# Patient Record
Sex: Female | Born: 1955
Health system: Southern US, Community
[De-identification: ages and names within clinical notes are randomized; demographics above are authoritative.]

## PROBLEM LIST (undated history)

## (undated) DIAGNOSIS — J45909 Unspecified asthma, uncomplicated: Secondary | ICD-10-CM

## (undated) DIAGNOSIS — J449 Chronic obstructive pulmonary disease, unspecified: Secondary | ICD-10-CM

## (undated) DIAGNOSIS — M1990 Unspecified osteoarthritis, unspecified site: Secondary | ICD-10-CM

## (undated) DIAGNOSIS — F419 Anxiety disorder, unspecified: Secondary | ICD-10-CM

## (undated) DIAGNOSIS — G709 Myoneural disorder, unspecified: Secondary | ICD-10-CM

## (undated) DIAGNOSIS — T7840XA Allergy, unspecified, initial encounter: Secondary | ICD-10-CM

## (undated) DIAGNOSIS — I10 Essential (primary) hypertension: Secondary | ICD-10-CM

## (undated) DIAGNOSIS — E785 Hyperlipidemia, unspecified: Secondary | ICD-10-CM

## (undated) DIAGNOSIS — M719 Bursopathy, unspecified: Secondary | ICD-10-CM

## (undated) DIAGNOSIS — D649 Anemia, unspecified: Secondary | ICD-10-CM

## (undated) DIAGNOSIS — M810 Age-related osteoporosis without current pathological fracture: Secondary | ICD-10-CM

## (undated) HISTORY — DX: Anxiety disorder, unspecified: F41.9

## (undated) HISTORY — DX: Unspecified osteoarthritis, unspecified site: M19.90

## (undated) HISTORY — DX: Myoneural disorder, unspecified: G70.9

## (undated) HISTORY — PX: POLYPECTOMY: SHX149

## (undated) HISTORY — DX: Hyperlipidemia, unspecified: E78.5

## (undated) HISTORY — PX: BREAST BIOPSY: SHX20

## (undated) HISTORY — PX: COLONOSCOPY: SHX174

## (undated) HISTORY — DX: Allergy, unspecified, initial encounter: T78.40XA

## (undated) HISTORY — DX: Anemia, unspecified: D64.9

---

## 1997-08-31 ENCOUNTER — Inpatient Hospital Stay (HOSPITAL_COMMUNITY): Admission: AD | Admit: 1997-08-31 | Discharge: 1997-09-04 | Payer: Self-pay | Admitting: Psychiatry

## 1998-04-17 ENCOUNTER — Emergency Department (HOSPITAL_COMMUNITY): Admission: EM | Admit: 1998-04-17 | Discharge: 1998-04-17 | Payer: Self-pay | Admitting: Emergency Medicine

## 1998-11-14 ENCOUNTER — Emergency Department (HOSPITAL_COMMUNITY): Admission: EM | Admit: 1998-11-14 | Discharge: 1998-11-14 | Payer: Self-pay | Admitting: Emergency Medicine

## 1998-12-06 ENCOUNTER — Emergency Department (HOSPITAL_COMMUNITY): Admission: EM | Admit: 1998-12-06 | Discharge: 1998-12-06 | Payer: Self-pay | Admitting: Emergency Medicine

## 1998-12-06 ENCOUNTER — Encounter: Payer: Self-pay | Admitting: Emergency Medicine

## 1999-01-09 ENCOUNTER — Emergency Department (HOSPITAL_COMMUNITY): Admission: EM | Admit: 1999-01-09 | Discharge: 1999-01-09 | Payer: Self-pay | Admitting: Emergency Medicine

## 1999-01-23 ENCOUNTER — Emergency Department (HOSPITAL_COMMUNITY): Admission: EM | Admit: 1999-01-23 | Discharge: 1999-01-23 | Payer: Self-pay | Admitting: Emergency Medicine

## 1999-02-05 ENCOUNTER — Encounter: Admission: RE | Admit: 1999-02-05 | Discharge: 1999-02-05 | Payer: Self-pay | Admitting: Orthopedic Surgery

## 1999-02-05 ENCOUNTER — Encounter: Payer: Self-pay | Admitting: Orthopedic Surgery

## 1999-02-22 ENCOUNTER — Emergency Department (HOSPITAL_COMMUNITY): Admission: EM | Admit: 1999-02-22 | Discharge: 1999-02-22 | Payer: Self-pay | Admitting: Emergency Medicine

## 1999-02-24 ENCOUNTER — Emergency Department (HOSPITAL_COMMUNITY): Admission: EM | Admit: 1999-02-24 | Discharge: 1999-02-24 | Payer: Self-pay | Admitting: Emergency Medicine

## 1999-08-27 ENCOUNTER — Emergency Department (HOSPITAL_COMMUNITY): Admission: EM | Admit: 1999-08-27 | Discharge: 1999-08-27 | Payer: Self-pay | Admitting: Emergency Medicine

## 1999-10-03 ENCOUNTER — Encounter: Payer: Self-pay | Admitting: Emergency Medicine

## 1999-10-03 ENCOUNTER — Emergency Department (HOSPITAL_COMMUNITY): Admission: EM | Admit: 1999-10-03 | Discharge: 1999-10-03 | Payer: Self-pay | Admitting: Emergency Medicine

## 2000-01-12 ENCOUNTER — Emergency Department (HOSPITAL_COMMUNITY): Admission: EM | Admit: 2000-01-12 | Discharge: 2000-01-12 | Payer: Self-pay | Admitting: Emergency Medicine

## 2000-03-01 ENCOUNTER — Emergency Department (HOSPITAL_COMMUNITY): Admission: EM | Admit: 2000-03-01 | Discharge: 2000-03-01 | Payer: Self-pay | Admitting: Emergency Medicine

## 2000-03-13 ENCOUNTER — Emergency Department (HOSPITAL_COMMUNITY): Admission: EM | Admit: 2000-03-13 | Discharge: 2000-03-13 | Payer: Self-pay | Admitting: Emergency Medicine

## 2000-03-19 ENCOUNTER — Emergency Department (HOSPITAL_COMMUNITY): Admission: EM | Admit: 2000-03-19 | Discharge: 2000-03-19 | Payer: Self-pay | Admitting: *Deleted

## 2002-01-06 HISTORY — PX: BACK SURGERY: SHX140

## 2002-03-30 ENCOUNTER — Ambulatory Visit (HOSPITAL_COMMUNITY): Admission: RE | Admit: 2002-03-30 | Discharge: 2002-03-30 | Payer: Self-pay | Admitting: Pulmonary Disease

## 2002-03-30 ENCOUNTER — Encounter: Payer: Self-pay | Admitting: Pulmonary Disease

## 2002-04-29 ENCOUNTER — Encounter: Payer: Self-pay | Admitting: Pulmonary Disease

## 2002-04-29 ENCOUNTER — Ambulatory Visit (HOSPITAL_COMMUNITY): Admission: RE | Admit: 2002-04-29 | Discharge: 2002-04-29 | Payer: Self-pay | Admitting: Pulmonary Disease

## 2002-05-31 ENCOUNTER — Encounter: Payer: Self-pay | Admitting: Gastroenterology

## 2002-05-31 ENCOUNTER — Encounter: Admission: RE | Admit: 2002-05-31 | Discharge: 2002-05-31 | Payer: Self-pay | Admitting: Gastroenterology

## 2002-10-24 ENCOUNTER — Encounter: Payer: Self-pay | Admitting: Radiology

## 2002-10-24 ENCOUNTER — Encounter: Payer: Self-pay | Admitting: Family Medicine

## 2002-10-24 ENCOUNTER — Encounter: Admission: RE | Admit: 2002-10-24 | Discharge: 2002-10-24 | Payer: Self-pay | Admitting: Family Medicine

## 2002-11-14 ENCOUNTER — Encounter: Admission: RE | Admit: 2002-11-14 | Discharge: 2002-11-14 | Payer: Self-pay | Admitting: Family Medicine

## 2002-11-29 ENCOUNTER — Encounter: Admission: RE | Admit: 2002-11-29 | Discharge: 2002-11-29 | Payer: Self-pay | Admitting: Family Medicine

## 2002-12-21 ENCOUNTER — Ambulatory Visit (HOSPITAL_COMMUNITY): Admission: RE | Admit: 2002-12-21 | Discharge: 2002-12-22 | Payer: Self-pay | Admitting: Neurological Surgery

## 2003-12-21 ENCOUNTER — Other Ambulatory Visit: Admission: RE | Admit: 2003-12-21 | Discharge: 2003-12-21 | Payer: Self-pay | Admitting: Obstetrics and Gynecology

## 2003-12-28 ENCOUNTER — Encounter: Admission: RE | Admit: 2003-12-28 | Discharge: 2003-12-28 | Payer: Self-pay | Admitting: Internal Medicine

## 2004-01-05 ENCOUNTER — Inpatient Hospital Stay (HOSPITAL_COMMUNITY): Admission: RE | Admit: 2004-01-05 | Discharge: 2004-01-07 | Payer: Self-pay | Admitting: Obstetrics and Gynecology

## 2004-01-07 HISTORY — PX: KNEE ARTHROPLASTY: SHX992

## 2005-02-28 ENCOUNTER — Encounter: Admission: RE | Admit: 2005-02-28 | Discharge: 2005-02-28 | Payer: Self-pay | Admitting: Internal Medicine

## 2006-01-11 ENCOUNTER — Emergency Department (HOSPITAL_COMMUNITY): Admission: EM | Admit: 2006-01-11 | Discharge: 2006-01-11 | Payer: Self-pay | Admitting: Family Medicine

## 2007-01-26 ENCOUNTER — Emergency Department (HOSPITAL_COMMUNITY): Admission: EM | Admit: 2007-01-26 | Discharge: 2007-01-26 | Payer: Self-pay | Admitting: Emergency Medicine

## 2007-03-28 ENCOUNTER — Emergency Department (HOSPITAL_COMMUNITY): Admission: EM | Admit: 2007-03-28 | Discharge: 2007-03-28 | Payer: Self-pay | Admitting: Family Medicine

## 2007-04-11 ENCOUNTER — Emergency Department (HOSPITAL_COMMUNITY): Admission: EM | Admit: 2007-04-11 | Discharge: 2007-04-11 | Payer: Self-pay | Admitting: Family Medicine

## 2008-11-22 ENCOUNTER — Emergency Department (HOSPITAL_COMMUNITY): Admission: EM | Admit: 2008-11-22 | Discharge: 2008-11-22 | Payer: Self-pay | Admitting: Family Medicine

## 2009-04-29 ENCOUNTER — Emergency Department (HOSPITAL_COMMUNITY): Admission: EM | Admit: 2009-04-29 | Discharge: 2009-04-29 | Payer: Self-pay | Admitting: Emergency Medicine

## 2010-01-16 ENCOUNTER — Encounter
Admission: RE | Admit: 2010-01-16 | Discharge: 2010-01-16 | Payer: Self-pay | Source: Home / Self Care | Attending: Internal Medicine | Admitting: Internal Medicine

## 2010-01-18 ENCOUNTER — Encounter
Admission: RE | Admit: 2010-01-18 | Discharge: 2010-01-18 | Payer: Self-pay | Source: Home / Self Care | Attending: Internal Medicine | Admitting: Internal Medicine

## 2010-02-04 ENCOUNTER — Other Ambulatory Visit: Payer: Self-pay | Admitting: General Surgery

## 2010-02-04 DIAGNOSIS — N6310 Unspecified lump in the right breast, unspecified quadrant: Secondary | ICD-10-CM

## 2010-02-07 ENCOUNTER — Other Ambulatory Visit: Payer: Self-pay | Admitting: General Surgery

## 2010-02-07 DIAGNOSIS — N6310 Unspecified lump in the right breast, unspecified quadrant: Secondary | ICD-10-CM

## 2010-02-12 ENCOUNTER — Ambulatory Visit
Admission: RE | Admit: 2010-02-12 | Discharge: 2010-02-12 | Disposition: A | Discharge status: Home / Self Care | Payer: 59 | Source: Ambulatory Visit | Attending: General Surgery | Admitting: General Surgery

## 2010-02-12 ENCOUNTER — Encounter (HOSPITAL_BASED_OUTPATIENT_CLINIC_OR_DEPARTMENT_OTHER)
Admission: RE | Admit: 2010-02-12 | Discharge: 2010-02-12 | Disposition: A | Discharge status: Home / Self Care | Payer: 59 | Source: Ambulatory Visit | Attending: General Surgery | Admitting: General Surgery

## 2010-02-12 ENCOUNTER — Other Ambulatory Visit: Payer: Self-pay | Admitting: General Surgery

## 2010-02-12 DIAGNOSIS — Z01812 Encounter for preprocedural laboratory examination: Secondary | ICD-10-CM | POA: Insufficient documentation

## 2010-02-12 DIAGNOSIS — Z01811 Encounter for preprocedural respiratory examination: Secondary | ICD-10-CM

## 2010-02-12 DIAGNOSIS — Z01810 Encounter for preprocedural cardiovascular examination: Secondary | ICD-10-CM | POA: Insufficient documentation

## 2010-02-12 LAB — COMPREHENSIVE METABOLIC PANEL
ALT: 31 U/L (ref 0–35)
AST: 18 U/L (ref 0–37)
Albumin: 3.6 g/dL (ref 3.5–5.2)
Alkaline Phosphatase: 80 U/L (ref 39–117)
BUN: 5 mg/dL — ABNORMAL LOW (ref 6–23)
CO2: 26 mEq/L (ref 19–32)
Calcium: 9.1 mg/dL (ref 8.4–10.5)
Chloride: 105 mEq/L (ref 96–112)
Creatinine, Ser: 0.75 mg/dL (ref 0.4–1.2)
GFR calc Af Amer: >60 mL/min (ref >60)
GFR calc non Af Amer: >60 mL/min (ref >60)
Glucose, Bld: 76 mg/dL (ref 70–99)
Potassium: 4 mEq/L (ref 3.5–5.1)
Sodium: 139 mEq/L (ref 135–145)
Total Bilirubin: 0.2 mg/dL — ABNORMAL LOW (ref 0.3–1.2)
Total Protein: 7.1 g/dL (ref 6.0–8.3)

## 2010-02-12 LAB — CBC
HCT: 32.9 % — ABNORMAL LOW (ref 36.0–46.0)
Hemoglobin: 10.7 g/dL — ABNORMAL LOW (ref 12.0–15.0)
MCH: 25.7 pg — ABNORMAL LOW (ref 26.0–34.0)
MCHC: 32.5 g/dL (ref 30.0–36.0)
MCV: 78.9 fL (ref 78.0–100.0)
Platelets: 505 10*3/uL — ABNORMAL HIGH (ref 150–400)
RBC: 4.17 MIL/uL (ref 3.87–5.11)
RDW: 14.2 % (ref 11.5–15.5)
WBC: 11.5 10*3/uL — ABNORMAL HIGH (ref 4.0–10.5)

## 2010-02-12 LAB — URINALYSIS, ROUTINE W REFLEX MICROSCOPIC
Bilirubin Urine: NEGATIVE
Hgb urine dipstick: NEGATIVE
Ketones, ur: NEGATIVE mg/dL
Nitrite: NEGATIVE
Protein, ur: NEGATIVE mg/dL
Specific Gravity, Urine: 1.014 (ref 1.005–1.030)
Urine Glucose, Fasting: NEGATIVE mg/dL
Urobilinogen, UA: 1 mg/dL (ref 0.0–1.0)
pH: 6 (ref 5.0–8.0)

## 2010-02-12 LAB — DIFFERENTIAL
Basophils Absolute: 0 10*3/uL (ref 0.0–0.1)
Basophils Relative: 0 % (ref 0–1)
Eosinophils Absolute: 0.1 10*3/uL (ref 0.0–0.7)
Eosinophils Relative: 1 % (ref 0–5)
Lymphocytes Relative: 39 % (ref 12–46)
Lymphs Abs: 4.5 10*3/uL — ABNORMAL HIGH (ref 0.7–4.0)
Monocytes Absolute: 1 10*3/uL (ref 0.1–1.0)
Monocytes Relative: 8 % (ref 3–12)
Neutro Abs: 5.9 10*3/uL (ref 1.7–7.7)
Neutrophils Relative %: 51 % (ref 43–77)

## 2010-02-15 ENCOUNTER — Ambulatory Visit
Admission: RE | Admit: 2010-02-15 | Discharge: 2010-02-15 | Disposition: A | Discharge status: Home / Self Care | Payer: 59 | Source: Ambulatory Visit | Attending: General Surgery | Admitting: General Surgery

## 2010-02-15 ENCOUNTER — Other Ambulatory Visit: Payer: Self-pay | Admitting: General Surgery

## 2010-02-15 ENCOUNTER — Ambulatory Visit (HOSPITAL_BASED_OUTPATIENT_CLINIC_OR_DEPARTMENT_OTHER)
Admission: RE | Admit: 2010-02-15 | Discharge: 2010-02-15 | Disposition: A | Discharge status: Home / Self Care | Payer: 59 | Source: Ambulatory Visit | Attending: General Surgery | Admitting: General Surgery

## 2010-02-15 DIAGNOSIS — D249 Benign neoplasm of unspecified breast: Secondary | ICD-10-CM | POA: Insufficient documentation

## 2010-02-15 DIAGNOSIS — N6310 Unspecified lump in the right breast, unspecified quadrant: Secondary | ICD-10-CM

## 2010-02-17 NOTE — Op Note (Signed)
NAMEMARIJAYNE, Julia Howard             ACCOUNT NO.:  [id number]  MEDICAL RECORD NO.:  [id number]            PATIENT TYPE:  LOCATION:                                 FACILITY:  PHYSICIAN:  Angelia Mould. Derrell Lolling, M.D.     DATE OF BIRTH:  DATE OF PROCEDURE:  02/15/2010 DATE OF DISCHARGE:                              OPERATIVE REPORT   PREOPERATIVE DIAGNOSIS:  Intraductal papilloma, right breast.  POSTOPERATIVE DIAGNOSIS:  Intraductal papilloma, right breast.  OPERATION PERFORMED:  Right partial mastectomy with needle localization.  SURGEON:  Angelia Mould. Derrell Lolling, MD  OPERATIVE INDICATIONS:  This is a 55 year old African American female who had a mammogram 4 years ago, which was reportedly normal, and then had recent mammograms for screening.  The radiologist said he could palpate a tiny mass at the 11 o'clock position of the right breast. Ultrasound showed a solid nodule about 10 mm in diameter in this location.  No other abnormalities were noted.  Image-guided biopsy showed intraductal papilloma with sclerosis and calcifications.  She was referred for complete excision of this to rule out occult carcinoma.  On exam, her breasts were small, and there was a bruise in the 12 o'clock position above the areolar margin.  There is no significant mass or adenopathy.  She was brought to the operating room electively.  OPERATIVE TECHNIQUE:  The patient underwent wire localization at the [facility] this morning.  The wire entered from the lateral aspect and was directed superomedially.  The wire went immediately past the [facility] clip, and some calcifications were in the area as well.  The patient was taken to the operating room.  General anesthesia with an LMA device was induced.  The right breast was prepped and draped in sterile fashion.  Intravenous antibiotics were given.  A 0.5% Marcaine with epinephrine was used as a local infiltration anesthetic.  I chose to make a  radially oriented elliptical incision to encompass the wire at about the 9 o'clock position.  The incision went up to just outside the areolar margin.  Dissection was carried down into the breast tissue and we dissected medially widely around the wire.  We went well under the nipple, and we went all the way down to the chest wall.  We removed the specimen.  We marked it with the six-color margin marker kit.  We then put the specimen in the Faxitron.  We could not see the clip, but the wire appeared to be in the center of the specimen on biplane imaging, all the architecture in the area seemed to indicate that we had completely excised this.  I discussed this with the radiologist, and he was also completely satisfied that we had removed the area in question.  It was believed that the [facility] clip became dislodged.  We did not feel that any further excision was necessary.  I did trim off a little bit of retroareolar tissue, which felt a little bit firm, but this seemed to be well medial to the area of question. Hemostasis was excellent and achieved with electrocautery.  The wound was irrigated with saline.  The  breast tissue was closed with interrupted sutures of 3-0 Vicryl, and the skin closed with running subcuticular suture of 4-0 Monocryl and Dermabond.  The patient was taken to the recovery room in stable condition.  Estimated blood loss was 10 mL.  Complications none. Sponge, needle, and instrument counts were correct.     Angelia Mould. Derrell Lolling, M.D.     HMI/MEDQ  D:  02/15/2010  T:  02/15/2010  Job:  [id number]  cc:   [facility], M.D.  Electronically Signed by Claud Kelp M.D. on 02/17/2010 03:55:34 PM

## 2010-03-23 ENCOUNTER — Inpatient Hospital Stay (INDEPENDENT_AMBULATORY_CARE_PROVIDER_SITE_OTHER)
Admission: RE | Admit: 2010-03-23 | Discharge: 2010-03-23 | Disposition: A | Discharge status: Home / Self Care | Payer: 59 | Source: Ambulatory Visit | Attending: Emergency Medicine | Admitting: Emergency Medicine

## 2010-03-23 DIAGNOSIS — T7840XA Allergy, unspecified, initial encounter: Secondary | ICD-10-CM

## 2010-04-10 LAB — CULTURE, ROUTINE-ABSCESS

## 2010-05-24 NOTE — H&P (Signed)
NAMEMORGAINE, KIMBALL              ACCOUNT NO.:  [id number]   MEDICAL RECORD NO.:  [id number]          PATIENT TYPE:  AMB   LOCATION:  SDC                           FACILITY:  [facility]   PHYSICIAN:  James A. Ashley Royalty, M.D.DATE OF BIRTH:  08-01-1955   DATE OF ADMISSION:  01/05/2004  DATE OF DISCHARGE:                                HISTORY & PHYSICAL   This is a 55 year old, gravida 2, para 2 who presented to me December 21, 2003 complaining of left lower quadrant discomfort of approximately one  month duration. She describes the discomfort as aching occasionally  associated with nausea without vomiting. She denies any fever.  She states  it is sufficiently debilitating to warrant surgical intervention and  requests the same.   MEDICATIONS:  Advair, Singulair, albuterol.   PAST MEDICAL HISTORY:  1.  Chronic obstructive pulmonary disease.  2.  Allergic rhinitis.  3.  Asthma.  4.  Arthritis.   PAST SURGICAL HISTORY:  1.  Back surgery.  2.  TAH/RSO in 1988 by Dr. Bruna Potter.  3.  Cesarean section x2.   SOCIAL HISTORY:  The patient smokes one pack of cigarettes every 3 days.  Denies significant use of alcohol.   REVIEW OF SYMPTOMS:  Noncontributory.   PHYSICAL EXAMINATION:  GENERAL:  Black female in no acute distress.  VITAL SIGNS:  Afebrile, vital signs stable.  SKIN:  Warm and dry without lesions.  LYMPH NODES:  No supraclavicular, cervical or inguinal adenopathy.  HEENT:  Normocephalic.  NECK:  Supple without thyromegaly.  CHEST:  Lungs clear.  CARDIAC:  Regular rate and rhythm without murmur, gallop or rub.  BREASTS:  Deferred. She did have a left breast mass on her most recent  physical examination for which she was referred to [facility]  [place] [facility] for a mammogram.  ABDOMEN:  Soft and nontender without masses or organomegaly. Bowel sounds  are active.  MUSCULOSKELETAL:  Reveals full range of motion without edema, cyanosis or  CVA tenderness.  PELVIC:  External  genitalia within normal limits. Vagina is without gross  lesions. The cervix and uterus are surgically absent.  Adnexa is without  palpable mass. Rectovaginal exam confirms.   IMPRESSION:  1.  Pelvic pain--etiology uncertain. Differential includes primary GI,      adhesions, endometriosis, etc.  2.  Chronic obstructive pulmonary disease.  3.  Asthma.  4.  Arthritis.  5.  Status post total abdominal hysterectomy/right salpingo-oophorectomy.  6.  History of noncompliance with gynecologic care.  7.  Smoker.  8.  Left breast mass--currently being worked up by [facility]      [place].   PLAN:  Diagnostic/operative laparoscopy.  The risks, benefits, complications  and alternatives were discussed with the patient.  Discussed the possibility  of removal of the only adnexa remaining (left side).  This was agreeable to  the patient.  The possibility of exploratory laparotomy was discussed and  accepted as well. Questions invited and answered.      JAM/MEDQ  D:  01/05/2004  T:  01/05/2004  Job:  [id number]

## 2010-05-24 NOTE — Discharge Summary (Signed)
NAMEKIORA, HALLBERG              ACCOUNT NO.:  [id number]   MEDICAL RECORD NO.:  [id number]          PATIENT TYPE:  INP   LOCATION:  9320                          [facility]ACILITY:  [facility]   PHYSICIAN:  James A. Ashley Royalty, M.D.DATE OF BIRTH:  [birth year]   DATE OF ADMISSION:  01/05/2004  DATE OF DISCHARGE:  01/07/2004                                 DISCHARGE SUMMARY   DISCHARGE DIAGNOSES:  1.  Pelvic pain.  2.  Status post diagnostic laparoscopy.  3.  Status post exploratory laparotomy with lysis of adhesions.   CONSULTATIONS:  None.   DISCHARGE MEDICATIONS:  Percocet.   HISTORY AND PHYSICAL:  This is a 55 year old gravida 2, para 2, who had  diagnostic/operative laparoscopy for pelvic pain.  She was status post  hysterectomy and right salpingo-oophorectomy in 1988.  For the remainder of  the history and physical, please see chart.   HOSPITAL COURSE:  The patient was brought in for outpatient surgery  consisting of diagnostic laparoscopy.  She signed a permit, however, for  laparotomy and indicated procedures.  She was taken to the operating room on  January 05, 2004, and underwent diagnostic/operative laparoscopy (open  procedure).  In the course of the laparoscopy, it was noted the patient had  extensive pelvic adhesions, which were not amenable to lysis via the  laparoscope.  Hence, the patient underwent exploratory laparotomy with lysis  of adhesions.  The procedure was uncomplicated.   The patient's postoperative course was benign.  She was discharged on the  second postoperative day, afebrile and in satisfactory condition.  She was  to return to the office in 1-2 days for clip removal.   ACCESSORY CLINICAL FINDINGS:  Hemoglobin and hematocrit upon admission were  11.9 and 36.1, respectively.  Repeat values obtained January 06, 2004, and  were 10.0 and 30.1, respectively.   DISPOSITION:  The patient is to return to [facility]  in 1-2 days for clip  removal as well as 4-6 weeks for postoperative  appointment.      JAM/MEDQ  D:  01/24/2004  T:  01/24/2004  Job:  [id number]

## 2010-05-24 NOTE — Op Note (Signed)
NAME:  Julia Howard, Julia Howard                        ACCOUNT NO.:  [id number]   MEDICAL RECORD NO.:  [id number]                   PATIENT TYPE:  OIB   LOCATION:  2899                                 FACILITY:  [facility]   PHYSICIAN:  Tia Alert, MD                  DATE OF BIRTH:  [birth year]   DATE OF PROCEDURE:  12/21/2002  DATE OF DISCHARGE:                                 OPERATIVE REPORT   PREOPERATIVE DIAGNOSIS:  Herniated disk, L5-S1, with a left S1  radiculopathy.   POSTOPERATIVE DIAGNOSIS:  Herniated disk, L5-S1, with a left S1  radiculopathy.   PROCEDURE:  Left hemilaminectomy, medial facetectomy and foraminotomy, L5-S1  for nerve root decompression utilizing the METRx retractor system and  microscopic dissection.   SURGEON:  Tia Alert, MD   ASSISTANT:  Donalee Citrin, M.D.   ANESTHESIA:  General endotracheal.   COMPLICATIONS:  None apparent.   INDICATIONS FOR PROCEDURE:  Julia Howard is a 55 year old black female who was  referred to the neurosurgery clinic with complaints of left leg pain in an  S1 distribution.  She had an MRI which showed degenerative disk disease with  a mild broad-based disk bulge at L5-S1 with some compression of the left S1  nerve root with some lateral recess stenosis.  I recommended a lumbar  microdiskectomy at L5-S1 on the left.  She understood the risks, benefits  and alternatives and wished to proceed.   DESCRIPTION OF PROCEDURE:  The patient was taken to the operating room and  after induction of adequate general endotracheal anesthesia, she was placed  in the prone position on the Wilson frame and all pressure points were  padded.  Her lumbar region was prepped with Duraprep and then draped in the  usual sterile fashion; 3 cc of local anesthesia was injected and a dorsal  midline incision was made.  Then under fluoroscopic guidance a K-wire was  passed to the L5-S1 nerve space on the left side.  Then sequential dilators  of the METRx  retractor system were then used until the final 3 cm retractor  was locked into position.  The operating microscope was brought into the  field and the remainder of the procedure was done under the operating  microscope utilizing microscopic dissection for nerve root decompression.  The Kerrison punch was used to perform a hemilaminectomy, medial facetectomy  and foraminotomy at L5-S1 on the left.  The ligament was opened and removed  with the Kerrison punch to expose the underlying dura and S1 nerve root.  We  followed the nerve root past the pedicle level and decompressed the lateral  recess.  We then pulled the nerve root medially gently and coagulated the  epidural venous vasculature and inspected the disk.  She did have a mild  broad-based disk bulge but no significant disk herniation and there seemed  to be no more pressure on the nerve  root; therefore, we did not perform a  diskectomy thinking this would put her at much less risk of recurrent disk  herniation.  The nerve root looked free and was pulsatile.  We irrigated  with copious amounts of Bacitracin containing saline solution, inspected the  nerve root once again, got all bleeding points with bipolar cautery and with  Gelfoam, removed the Gelfoam, removed the retractor, closed the fascia with  interrupted 3-  0 Vicryl, closed the subcutaneous tissue with interrupted 3-0 Vicryl and  closed the skin with Dermabond.  The drapes were removed.  The patient was  awakened from general anesthesia and transported to the recovery room in  stable condition.  At the end of the procedure, all sponge, needle and  instrument counts were correct.                                               Tia Alert, MD    DSJ/MEDQ  D:  12/21/2002  T:  12/21/2002  Job:  [id number]

## 2010-05-24 NOTE — Op Note (Signed)
NAMEXARENI, KELCH              ACCOUNT NO.:  [id number]   MEDICAL RECORD NO.:  [id number]          PATIENT TYPE:  INP   LOCATION:  9320                          [facility]ILITY[facility]  [facility]   PHYSICIAN:  James A. Ashley Royalty, M.D.DATE OF BIRTH:  1955-10-18   DATE OF PROCEDURE:  01/05/2004  DATE OF DISCHARGE:                                 OPERATIVE REPORT   PREOPERATIVE DIAGNOSES:  1.  Pelvic pain - debilitating.  2.  Status post hysterectomy and right salpingo-oophorectomy (by patient's      history).   POSTOPERATIVE DIAGNOSIS:  Pelvic adhesions.   PROCEDURE:  1.  Diagnostic laparoscopy (open).  2.  Exploratory laparotomy.  3.  Lysis of adhesions.   SURGEON:  Rudy Jew. Ashley Royalty, M.D.   ANESTHESIA:  General.   ESTIMATED BLOOD LOSS:  50 mL.   COMPLICATIONS:  None.   PACKS AND DRAINS:  Foley.   Sponge, needle, and instrument counts were reported as correct x 2.   DESCRIPTION OF PROCEDURE:  The patient was taken to the operating room and  placed in the dorsal supine position.  After general anesthesia was  administered, she was placed in the lithotomy position and prepped and  draped in the usual manner for abdominal and vaginal surgery.  Sponge stick  was placed in the vagina, and the bladder was drained with a red rubber  catheter.  She was draped for laparoscopy.  Next a 1.2 cm infraumbilical  incision was made in the longitudinal plane.  The subcutaneous tissues were  sharply and bluntly dissected down to the fascia which was incised with a  knife and opened to appendix 1.2 to 1.5 cm.  The peritoneum was entered  bluntly.  A 0 Vicryl stay suture was placed in the fascia, and the Hasson  open laparoscopic trocar was placed into the abdominal cavity.  It was  secured with the 0 Vicryl stay suture.  The laparoscope was then inserted  into the abdominal cavity.  Pneumoperitoneum was created with CO2.  Immediately upon visualizing the abdominal cavity, there were copious  adhesions  noted in the pelvis.  A 5 mm suprapubic trocar was placed in the  left lower quadrant using transillumination and direct visualization  techniques.  The pelvis was thoroughly surveyed.  There were a great deal of  omental adhesions noted to the anterior abdominal wall as well as mild  adhesions to the left and right pelvic sidewalls, making it difficult if not  impossible to see the patient's pelvis.  She was alleged to have a left  ovary and fallopian tube in situ.  After several attempts to initiate  adhesiolysis laparoscopically, the decision was made to abandon further  attempts to perform the procedure laparoscopically, and the decision was  made to switch to laparotomy.  The umbilical fascia was closed with the 0  Vicryl stay suture.  The skin was closed with 3-0 Monocryl in a subcuticular  fashion.  Hemostasis was noted.   Attention was then turned to the laparotomy.  A Pfannenstiel incision was  made intentionally slightly above the patient's old Pfannenstiel incision  and incorporating  the suprapubic incision made to accommodate the 5 mm  trocar.  Subcutaneous tissues were sharply dissected out down to the fascia  which was nicked with a knife and incised transversely with Mayo scissors.  The underlying rectus muscles were separated from the fascia using sharp and  blunt dissection.  The rectus muscles were separated in the midline,  exposing the peritoneum which was entered bluntly with the operator's  finger.  The incision was extended longitudinally.  In order to extend the  incision longitudinally, numerous omental adhesions were grasped, dissected  free, and the pedicles tied with 3-0 Vicryl.  Once enough adhesiolysis had  been performed in order to feel we could safely place a retractor, an  O'Connor-O'Sullivan retractor was placed in the abdominal cavity.  The upper  abdomen was packed off with laparotomy sponges after sufficient adhesiolysis  loops of small bowel.  At this  point, the pelvis was thoroughly inspected.  The uterus, of course, was surgically absent.  The right adnexa was absent  as well.  There was no readily identified left adnexa as well.  There was a  very small, whitish structure adherent to the left pelvic sidewall.  Though  this was felt to be consistent with an ovarian remnant, there was no ovary  proper or fallopian tube proper identified at all.   Irrigation was accomplished.  Hemostasis was noted.  The ureters were  identified on both sides and appeared to be well below the plane of  dissection.   At this point, the patient was felt to have benefitted maximally from the  surgical procedure.  All laparotomy sponges were removed.  The Lenox Ahr retractor was removed.  The peritoneum was then closed with a 3-0  Vicryl in a running fashion.  The fascia was closed with 0 Vicryl in a  running fashion.  The skin was closed with staples.   The patient tolerated the procedure extremely well and was returned to the  recovery room in good condition.  At the conclusion of the procedure, the  urine was clear and copious.      JAM/MEDQ  D:  01/05/2004  T:  01/05/2004  Job:  [id number]

## 2010-09-27 LAB — URINALYSIS, ROUTINE W REFLEX MICROSCOPIC
Bilirubin Urine: NEGATIVE
Glucose, UA: NEGATIVE
Ketones, ur: NEGATIVE
Nitrite: NEGATIVE
Protein, ur: NEGATIVE

## 2011-01-08 ENCOUNTER — Other Ambulatory Visit: Payer: Self-pay | Admitting: Family Medicine

## 2011-01-08 DIAGNOSIS — Z1231 Encounter for screening mammogram for malignant neoplasm of breast: Secondary | ICD-10-CM

## 2011-01-23 ENCOUNTER — Ambulatory Visit
Admission: RE | Admit: 2011-01-23 | Discharge: 2011-01-23 | Disposition: A | Discharge status: Home / Self Care | Payer: 59 | Source: Ambulatory Visit | Attending: Family Medicine | Admitting: Family Medicine

## 2011-01-23 DIAGNOSIS — Z1231 Encounter for screening mammogram for malignant neoplasm of breast: Secondary | ICD-10-CM

## 2011-07-14 ENCOUNTER — Encounter (HOSPITAL_COMMUNITY): Payer: Self-pay | Admitting: Emergency Medicine

## 2011-07-14 ENCOUNTER — Emergency Department (HOSPITAL_COMMUNITY)
Admission: EM | Admit: 2011-07-14 | Discharge: 2011-07-14 | Disposition: A | Discharge status: Home / Self Care | Payer: 59 | Source: Home / Self Care | Attending: Emergency Medicine | Admitting: Emergency Medicine

## 2011-07-14 DIAGNOSIS — M5440 Lumbago with sciatica, unspecified side: Secondary | ICD-10-CM

## 2011-07-14 DIAGNOSIS — M5430 Sciatica, unspecified side: Secondary | ICD-10-CM

## 2011-07-14 DIAGNOSIS — M62838 Other muscle spasm: Secondary | ICD-10-CM

## 2011-07-14 HISTORY — DX: Bursopathy, unspecified: M71.9

## 2011-07-14 HISTORY — DX: Age-related osteoporosis without current pathological fracture: M81.0

## 2011-07-14 HISTORY — DX: Essential (primary) hypertension: I10

## 2011-07-14 HISTORY — DX: Chronic obstructive pulmonary disease, unspecified: J44.9

## 2011-07-14 HISTORY — DX: Unspecified asthma, uncomplicated: J45.909

## 2011-07-14 MED ORDER — HYDROCODONE-ACETAMINOPHEN 5-325 MG PO TABS: 2.0000 | ORAL_TABLET | ORAL | Status: AC | PRN

## 2011-07-14 MED ORDER — MELOXICAM 7.5 MG PO TABS: 7.5000 mg | ORAL_TABLET | Freq: Every day | ORAL | Status: AC

## 2011-07-14 MED ORDER — PREDNISONE (PAK) 10 MG PO TABS: ORAL_TABLET | ORAL | Status: AC

## 2011-07-14 MED ORDER — METAXALONE 800 MG PO TABS: 800.0000 mg | ORAL_TABLET | Freq: Three times a day (TID) | ORAL | Status: AC

## 2011-07-14 NOTE — ED Notes (Signed)
Pt here with c/o left hip pain radiating to left lower back and calf that started x2 weeks ago.pt has hx bursitis and states it feels the same.intermit tingling and pins/needles sensation that worsens at night.no swelling or bruising seen

## 2011-07-14 NOTE — ED Provider Notes (Signed)
History     CSN: [id number]  Arrival date & time 07/14/11  1142   First MD Initiated Contact with Patient 07/14/11 1327      Chief Complaint  Patient presents with  . Hip Pain  . Back Pain    (Consider location/radiation/quality/duration/timing/severity/associated sxs/prior treatment) HPI Comments: Patient reports an acute exacerbation of her chronic left lower back, hip, knee pain starting 2 weeks ago. States that she is been doing a lot of lifting and bending while at work. Symptoms are worse at the end of the day, and him better in the morning. She reports tingling sensation radiating down the back of her legs her knee at night. Mother numbness, weakness. She has a history of osteoporosis, but denies any recent trauma. She does have remote history of injury to her left back, hip and knee, which required surgery about 15 years ago. She states that the pain today is not different than previous exacerbations.  ROS as noted in HPI. All other ROS negative.   Patient is a 56 y.o. female presenting with hip pain and back pain. The history is provided by the patient. No language interpreter was used.  Hip Pain This is a recurrent problem. The current episode started more than 1 week ago. The problem occurs constantly. The problem has not changed since onset.Pertinent negatives include no chest pain and no abdominal pain. The symptoms are aggravated by walking and bending. Nothing relieves the symptoms. She has tried nothing for the symptoms. The treatment provided no relief.  Back Pain  This is a chronic problem. The current episode started more than 1 week ago. The problem occurs constantly. The problem has not changed since onset.The pain is associated with lifting heavy objects. The pain is present in the lumbar spine, sacro-iliac joint and gluteal region. The quality of the pain is described as aching. The pain radiates to the left thigh and left knee. The pain is mild. The symptoms are  aggravated by bending, twisting and certain positions. The pain is worse during the day. Associated symptoms include leg pain and tingling. Pertinent negatives include no chest pain, no fever, no numbness, no abdominal pain, no abdominal swelling, no dysuria, no pelvic pain, no paresthesias, no paresis and no weakness. She has tried NSAIDs for the symptoms. Risk factors include a history of osteoporosis.    Past Medical History  Diagnosis Date  . Asthma   . COPD (chronic obstructive pulmonary disease)   . Hypertension   . Bursitis   . Osteoporosis     Past Surgical History  Procedure Date  . Knee arthroplasty 2006  . Back surgery 2004    History reviewed. No pertinent family history.  History  Substance Use Topics  . Smoking status: Never Smoker   . Smokeless tobacco: Not on file  . Alcohol Use: No    OB History    Grav Para Term Preterm Abortions TAB SAB Ect Mult Living                  Review of Systems  Constitutional: Negative for fever.  Cardiovascular: Negative for chest pain.  Gastrointestinal: Negative for abdominal pain.  Genitourinary: Negative for dysuria and pelvic pain.  Musculoskeletal: Positive for back pain.  Neurological: Positive for tingling. Negative for weakness, numbness and paresthesias.    Allergies  Penicillins and Sulfur  Home Medications   Current Outpatient Rx  Name Route Sig Dispense Refill  . ACETAMINOPHEN 500 MG PO TABS Oral Take 500 mg by  mouth every 6 (six) hours as needed.    . ALENDRONATE SODIUM 70 MG PO TABS Oral Take 70 mg by mouth every 7 (seven) days. Take with a full glass of water on an empty stomach.    . ATORVASTATIN CALCIUM 10 MG PO TABS Oral Take 10 mg by mouth daily.    Marland Kitchen FLUTICASONE-SALMETEROL 100-50 MCG/DOSE IN AEPB Inhalation Inhale 1 puff into the lungs every 12 (twelve) hours.    Marland Kitchen PRESCRIPTION MEDICATION  Unknown BP med    . HYDROCODONE-ACETAMINOPHEN 5-325 MG PO TABS Oral Take 2 tablets by mouth every 4 (four)  hours as needed for pain. 20 tablet 0  . MELOXICAM 7.5 MG PO TABS Oral Take 1 tablet (7.5 mg total) by mouth daily. 14 tablet 0  . METAXALONE 800 MG PO TABS Oral Take 1 tablet (800 mg total) by mouth 3 (three) times daily. 21 tablet 0  . PREDNISONE (PAK) 10 MG PO TABS  Dispense one 6 day pack. Take as directed with food. 21 tablet 0    BP 127/80  Pulse 84  Temp 98.9 F (37.2 C) (Oral)  Resp 16  SpO2 98%  Physical Exam  Nursing note and vitals reviewed. Constitutional: She is oriented to person, place, and time. She appears well-developed and well-nourished. No distress.  HENT:  Head: Normocephalic and atraumatic.  Eyes: Conjunctivae and EOM are normal.  Neck: Normal range of motion.  Cardiovascular: Normal rate.   Pulmonary/Chest: Effort normal.  Abdominal: She exhibits no distension.  Musculoskeletal: Normal range of motion.       Lumbar back: She exhibits pain and spasm.       Left upper leg: She exhibits tenderness. She exhibits no bony tenderness and no deformity.        Muscle spasm  left calf. Has otherwise symmetric. No evidence DVT.  Diffuse muscular tenderness over gluteal muscles, down IT band, quadriceps. No pain with passive abduction/adduction of leg. pain with int rotation hip. tenderness at sciatic notch. Roll test for muscle spasm positive. Flexion/extension knee WNL. Knee joint NT, stable. Motor strenght flexion/ext hip 5/5. Sensation to LT intact. DP 2+  Neurological: She is alert and oriented to person, place, and time. Coordination normal.  Skin: Skin is warm and dry.  Psychiatric: She has a normal mood and affect. Her behavior is normal. Judgment and thought content normal.    ED Course  Procedures (including critical care time)  Labs Reviewed - No data to display No results found.   1. Low back pain with sciatica   2. Leg muscle spasm       MDM  Patient admits to using poor mechanics when lifting heavy objects. He does have a metastatic this point  as well. Will send home with muscle relaxant, inset, steroids, Norco as needed. Will have her follow with Dr. Magnus Ivan, her orthopedic surgeon.  Luiz Blare, MD 07/14/11 [phone]

## 2011-12-12 ENCOUNTER — Other Ambulatory Visit: Payer: Self-pay | Admitting: Internal Medicine

## 2011-12-12 DIAGNOSIS — Z1231 Encounter for screening mammogram for malignant neoplasm of breast: Secondary | ICD-10-CM

## 2012-01-26 ENCOUNTER — Ambulatory Visit
Admission: RE | Admit: 2012-01-26 | Discharge: 2012-01-26 | Disposition: A | Discharge status: Home / Self Care | Payer: 59 | Source: Ambulatory Visit | Attending: Internal Medicine | Admitting: Internal Medicine

## 2012-01-26 DIAGNOSIS — Z1231 Encounter for screening mammogram for malignant neoplasm of breast: Secondary | ICD-10-CM

## 2012-12-24 ENCOUNTER — Other Ambulatory Visit: Payer: Self-pay

## 2012-12-24 DIAGNOSIS — Z1231 Encounter for screening mammogram for malignant neoplasm of breast: Secondary | ICD-10-CM

## 2013-01-04 ENCOUNTER — Encounter (HOSPITAL_COMMUNITY): Payer: Self-pay | Admitting: Emergency Medicine

## 2013-01-04 ENCOUNTER — Emergency Department (HOSPITAL_COMMUNITY)
Admission: EM | Admit: 2013-01-04 | Discharge: 2013-01-04 | Disposition: A | Discharge status: Home / Self Care | Payer: 59 | Attending: Emergency Medicine | Admitting: Emergency Medicine

## 2013-01-04 ENCOUNTER — Emergency Department (HOSPITAL_COMMUNITY)
Admission: EM | Admit: 2013-01-04 | Discharge: 2013-01-04 | Disposition: A | Discharge status: Nursing Home (Includes ICF) | Payer: 59 | Source: Home / Self Care

## 2013-01-04 ENCOUNTER — Emergency Department (HOSPITAL_COMMUNITY): Payer: 59

## 2013-01-04 DIAGNOSIS — R209 Unspecified disturbances of skin sensation: Secondary | ICD-10-CM | POA: Insufficient documentation

## 2013-01-04 DIAGNOSIS — I10 Essential (primary) hypertension: Secondary | ICD-10-CM | POA: Insufficient documentation

## 2013-01-04 DIAGNOSIS — J329 Chronic sinusitis, unspecified: Secondary | ICD-10-CM

## 2013-01-04 DIAGNOSIS — J449 Chronic obstructive pulmonary disease, unspecified: Secondary | ICD-10-CM | POA: Insufficient documentation

## 2013-01-04 DIAGNOSIS — R51 Headache: Secondary | ICD-10-CM | POA: Insufficient documentation

## 2013-01-04 DIAGNOSIS — Z8739 Personal history of other diseases of the musculoskeletal system and connective tissue: Secondary | ICD-10-CM | POA: Insufficient documentation

## 2013-01-04 DIAGNOSIS — R29818 Other symptoms and signs involving the nervous system: Secondary | ICD-10-CM

## 2013-01-04 DIAGNOSIS — H538 Other visual disturbances: Secondary | ICD-10-CM

## 2013-01-04 DIAGNOSIS — IMO0002 Reserved for concepts with insufficient information to code with codable children: Secondary | ICD-10-CM | POA: Insufficient documentation

## 2013-01-04 DIAGNOSIS — J4489 Other specified chronic obstructive pulmonary disease: Secondary | ICD-10-CM | POA: Insufficient documentation

## 2013-01-04 DIAGNOSIS — R2990 Unspecified symptoms and signs involving the nervous system: Secondary | ICD-10-CM

## 2013-01-04 DIAGNOSIS — Z880 Allergy status to penicillin: Secondary | ICD-10-CM | POA: Insufficient documentation

## 2013-01-04 DIAGNOSIS — Z792 Long term (current) use of antibiotics: Secondary | ICD-10-CM | POA: Insufficient documentation

## 2013-01-04 DIAGNOSIS — Z79899 Other long term (current) drug therapy: Secondary | ICD-10-CM | POA: Insufficient documentation

## 2013-01-04 DIAGNOSIS — R200 Anesthesia of skin: Secondary | ICD-10-CM

## 2013-01-04 LAB — GLUCOSE, CAPILLARY: Glucose-Capillary: 79 mg/dL (ref 70–99)

## 2013-01-04 LAB — CBC WITH DIFFERENTIAL/PLATELET
Basophils Absolute: 0 10*3/uL (ref 0.0–0.1)
Basophils Relative: 0 % (ref 0–1)
Eosinophils Absolute: 0.1 10*3/uL (ref 0.0–0.7)
HCT: 35.2 % — ABNORMAL LOW (ref 36.0–46.0)
Hemoglobin: 11.6 g/dL — ABNORMAL LOW (ref 12.0–15.0)
MCH: 26.4 pg (ref 26.0–34.0)
MCHC: 33 g/dL (ref 30.0–36.0)
Monocytes Absolute: 0.9 10*3/uL (ref 0.1–1.0)
Monocytes Relative: 6 % (ref 3–12)
Neutro Abs: 9.6 10*3/uL — ABNORMAL HIGH (ref 1.7–7.7)
Neutrophils Relative %: 63 % (ref 43–77)
RDW: 14.3 % (ref 11.5–15.5)

## 2013-01-04 LAB — COMPREHENSIVE METABOLIC PANEL
AST: 20 U/L (ref 0–37)
Albumin: 3.9 g/dL (ref 3.5–5.2)
BUN: 6 mg/dL (ref 6–23)
Chloride: 104 mEq/L (ref 96–112)
Creatinine, Ser: 0.69 mg/dL (ref 0.50–1.10)
Total Bilirubin: <0.2 mg/dL — ABNORMAL LOW (ref 0.3–1.2)
Total Protein: 7.6 g/dL (ref 6.0–8.3)

## 2013-01-04 LAB — PROTIME-INR: Prothrombin Time: 13.5 seconds (ref 11.6–15.2)

## 2013-01-04 LAB — APTT: aPTT: 28 seconds (ref 24–37)

## 2013-01-04 LAB — SEDIMENTATION RATE: Sed Rate: 33 mm/hr — ABNORMAL HIGH (ref 0–22)

## 2013-01-04 MED ORDER — LEVOFLOXACIN 500 MG PO TABS: 500.0000 mg | ORAL_TABLET | Freq: Every day | ORAL | Status: DC

## 2013-01-04 NOTE — Consult Note (Signed)
Referring Physician: Estell Harpin    Chief Complaint: right facial pain and right eye blurred vision  HPI:                                                                                                                                         Julia Howard is an 57 y.o. female who woke up at 2 AM and felt normal.  At 5 AM she was at work, blew her nose and noted a right facial pain located over her right forehead and temporal region.  This pain ten migrated to her eye and just below her eye.  She then started to note blurred vision in her right eye--only the right eye.  Due to these symptoms she was brought to ED as a CODE STROKE. CT head was obtained and showed Extensive mucosal thickening in the imaged paranasal sinuses, especially the maxillary and ethmoids. There is diffuse retained frothy secretions with maxillary sinus effusions. No acute infarct was noted. Patient currently remains to have right facial pain, right eye blurred vision and pain to palpation of right ethmoid and maxillary sinuses.    Date last known well: Date: 01/04/2013 Time last known well: Time: 05:00 tPA Given: No: out of the window and minimal symptoms  Past Medical History  Diagnosis Date  . Asthma   . COPD (chronic obstructive pulmonary disease)   . Hypertension   . Bursitis   . Osteoporosis     Past Surgical History  Procedure Laterality Date  . Knee arthroplasty  2006  . Back surgery  2004    No family history on file. Social History:  reports that she has never smoked. She does not have any smokeless tobacco history on file. She reports that she does not drink alcohol or use illicit drugs.  Allergies:  Allergies  Allergen Reactions  . Penicillins   . Sulfur     Medications:                                                                                                                           No current facility-administered medications for this encounter.   Current Outpatient Prescriptions   Medication Sig Dispense Refill  . acetaminophen (TYLENOL) 500 MG tablet Take 500 mg by mouth every 6 (six) hours as needed.      . ALBUTEROL IN Inhale into the lungs.      Marland Kitchen  alendronate (FOSAMAX) 70 MG tablet Take 70 mg by mouth every 7 (seven) days. Take with a full glass of water on an empty stomach.      Marland Kitchen atorvastatin (LIPITOR) 10 MG tablet Take 10 mg by mouth daily.      . Fluticasone-Salmeterol (ADVAIR) 100-50 MCG/DOSE AEPB Inhale 1 puff into the lungs every 12 (twelve) hours.      Marland Kitchen PRESCRIPTION MEDICATION Unknown BP med        ROS:                                                                                                                                       History obtained from the patient  General ROS: negative for - chills, fatigue, fever, night sweats, weight gain or weight loss Psychological ROS: negative for - behavioral disorder, hallucinations, memory difficulties, mood swings or suicidal ideation Ophthalmic ROS: negative for - blurry vision, double vision, eye pain or loss of vision ENT ROS: negative for - epistaxis, nasal discharge, oral lesions, sore throat, tinnitus or vertigo Allergy and Immunology ROS: negative for - hives or itchy/watery eyes Hematological and Lymphatic ROS: negative for - bleeding problems, bruising or swollen lymph nodes Endocrine ROS: negative for - galactorrhea, hair pattern changes, polydipsia/polyuria or temperature intolerance Respiratory ROS: negative for - cough, hemoptysis, shortness of breath or wheezing Cardiovascular ROS: negative for - chest pain, dyspnea on exertion, edema or irregular heartbeat Gastrointestinal ROS: negative for - abdominal pain, diarrhea, hematemesis, nausea/vomiting or stool incontinence Genito-Urinary ROS: negative for - dysuria, hematuria, incontinence or urinary frequency/urgency Musculoskeletal ROS: negative for - joint swelling or muscular weakness Neurological ROS: as noted in HPI Dermatological ROS:  negative for rash and skin lesion changes  Neurologic Examination:                                                                                                      Blood pressure 142/68, pulse 86, temperature 98.4 F (36.9 C), temperature source Oral, resp. rate 16, height 5\' 2"  (1.575 m), weight 51.71 kg (114 lb), SpO2 100.00%. General: No apparent distress  Mental Status: Alert, oriented, thought content appropriate.  Speech fluent without evidence of aphasia.  Able to follow 3 step commands without difficulty. Cranial Nerves: II: Discs flat bilaterally; Visual fields grossly normal but subjectively states she has blurred vision in right eye, pupils equal, round, reactive to light and accommodation III,IV, VI: ptosis not present, extra-ocular motions intact bilaterally V,VII: smile symmetric, facial light touch sensation  stated to be decreased on the right face but also states she has pain to palpation over right temple and maxillary sinuses.  VIII: hearing normal bilaterally IX,X: gag reflex present XI: bilateral shoulder shrug XII: midline tongue extension without atrophy or fasciculations  Motor: Right : Upper extremity   5/5    Left:     Upper extremity   5/5  Lower extremity   5/5     Lower extremity   5/5 Tone and bulk:normal tone throughout; no atrophy noted Sensory: Pinprick and light touch intact throughout, bilaterally Deep Tendon Reflexes:  Right: Upper Extremity   Left: Upper extremity   biceps (C-5 to C-6) 2/4   biceps (C-5 to C-6) 2/4 tricep (C7) 2/4    triceps (C7) 2/4 Brachioradialis (C6) 2/4  Brachioradialis (C6) 2/4  Lower Extremity Lower Extremity  quadriceps (L-2 to L-4) 2/4   quadriceps (L-2 to L-4) 2/4 Achilles (S1) 1/4   Achilles (S1) 1/4  Plantars: Right: downgoing   Left: downgoing Cerebellar: normal finger-to-nose,  normal heel-to-shin test Gait: not tested due to multiple leads CV: pulses palpable throughout    Results for orders placed  during the hospital encounter of 01/04/13 (from the past 48 hour(s))  CBC WITH DIFFERENTIAL     Status: Abnormal   Collection Time    01/04/13 10:05 AM      Result Value Range   WBC 15.3 (*) 4.0 - 10.5 K/uL   RBC 4.40  3.87 - 5.11 MIL/uL   Hemoglobin 11.6 (*) 12.0 - 15.0 g/dL   HCT 16.0 (*) 10.9 - 32.3 %   MCV 80.0  78.0 - 100.0 fL   MCH 26.4  26.0 - 34.0 pg   MCHC 33.0  30.0 - 36.0 g/dL   RDW 55.7  32.2 - 02.5 %   Platelets 425 (*) 150 - 400 K/uL   Neutrophils Relative % 63  43 - 77 %   Neutro Abs 9.6 (*) 1.7 - 7.7 K/uL   Lymphocytes Relative 31  12 - 46 %   Lymphs Abs 4.7 (*) 0.7 - 4.0 K/uL   Monocytes Relative 6  3 - 12 %   Monocytes Absolute 0.9  0.1 - 1.0 K/uL   Eosinophils Relative 1  0 - 5 %   Eosinophils Absolute 0.1  0.0 - 0.7 K/uL   Basophils Relative 0  0 - 1 %   Basophils Absolute 0.0  0.0 - 0.1 K/uL  GLUCOSE, CAPILLARY     Status: None   Collection Time    01/04/13 10:06 AM      Result Value Range   Glucose-Capillary 79  70 - 99 mg/dL   Comment 1 Notify RN     Comment 2 Documented in Chart     Ct Head Wo Contrast  01/04/2013   CLINICAL DATA:  Stroke here.  Slurred speech and headache.  EXAM: CT HEAD WITHOUT CONTRAST  TECHNIQUE: Contiguous axial images were obtained from the base of the skull through the vertex without intravenous contrast.  COMPARISON:  None.  FINDINGS: Skull and Sinuses:Extensive mucosal thickening in the imaged paranasal sinuses, especially the maxillary and ethmoids. There is diffuse retained frothy secretions with maxillary sinus effusions. Question previous medial maxillary antrostomies. Remote blowout fracture of the medial wall left orbit.  Orbits: No acute abnormality.  Brain: No evidence of acute abnormality, such as acute large territory infarction, hemorrhage, hydrocephalus, or mass lesion/mass effect. There is a small, fairly ill-defined area of low attenuation, sub lentiform on the  left, measuring no more than 7 mm.  These results were  called by telephone at the time of interpretation on 01/04/2013 at 10:29 AM to Dr. Amada Jupiter, who verbally acknowledged these results.  IMPRESSION: 1. No evidence of acute hemorrhage or large territory infarct. 2. Small low-density near the low left sylvian fissure, favor dilated perivascular space over lacunar infarct. 3. Headache could be related to diffuse sinusitis, with acute features.   Electronically Signed   By: Tiburcio Pea M.D.   On: 01/04/2013 10:30    Assessment and plan discussed with with attending physician and they are in agreement.    Stroke Risk Factors - hypertension  Felicie Morn PA-C [facility] [phone]  01/04/2013, 10:35 AM   Assessment: 57 y.o. female with rapidly progressive right facial pain, blurred vision (though acuity is actually very good), different sensation on the right side her face than the left. She has clear sinusitis of the ethmoid and maxillary sinuses. She has tenderness to palpation of her sinuses. I suspect that the acute pain is related to sinusitis, and the patient is experiencing slightly different perception on that side secondary to the pain that is experiencing on that side.   The question of temporal arteritis was  raised, but her story is very atypical, she has equal temporal pulses, her ESR is mildly elevated but this would be expected in sinusitis, she has another clear etiology for her pain. Though the blurred vision is unusual, I feel with the other findings sinusitis is the most likely underlying etiology for all of her symptoms today.  With continuing to have some difference in sensation and negative MRI, I don't think that TIA is likely.  Ritta Slot, MD [facility] [phone]  If 7pm- 7am, please page neurology on call at [phone].

## 2013-01-04 NOTE — ED Provider Notes (Signed)
Medical screening examination/treatment/procedure(s) were performed by non-physician practitioner and as supervising physician I was immediately available for consultation/collaboration.  Ma Munoz, M.D.  Krystyne Tewksbury C Clinten Howk, MD 01/04/13 1526 

## 2013-01-04 NOTE — ED Notes (Signed)
Reports having pain behind right eye with numbness on right side of face down to neck. Having blurred vision.   On set this a.m.   Stares recently getting over the flu.  Denies any other symptoms.

## 2013-01-04 NOTE — ED Notes (Signed)
Screening done with patient wearing glasses. Mw,cma

## 2013-01-04 NOTE — ED Notes (Addendum)
While at work sudden onset right side facial numbness, blurred vision right eye & difficulty speaking. Sent here from [facility] by shuttle

## 2013-01-04 NOTE — ED Notes (Addendum)
Dr. Amada Jupiter at bedside. Pt now c/o pain to right temple area. Also reports has had a cold/cough

## 2013-01-04 NOTE — ED Provider Notes (Signed)
CSN: [id number]     Arrival date & time 01/04/13  4098 History   First MD Initiated Contact with Patient 01/04/13 [phone]     Chief Complaint  Patient presents with  . Code Stroke   (Consider location/radiation/quality/duration/timing/severity/associated sxs/prior Treatment) Patient is a 57 y.o. female presenting with Acute Neurological Problem. The history is provided by the patient (the pt states at 5am she had some slurred speech and facial numbness. the slurred speech has improved now).  Cerebrovascular Accident This is a new problem. The current episode started 3 to 5 hours ago. The problem occurs constantly. The problem has been rapidly improving. Associated symptoms include headaches. Pertinent negatives include no chest pain and no abdominal pain. Nothing aggravates the symptoms. Nothing relieves the symptoms.    Past Medical History  Diagnosis Date  . Asthma   . COPD (chronic obstructive pulmonary disease)   . Hypertension   . Bursitis   . Osteoporosis    Past Surgical History  Procedure Laterality Date  . Knee arthroplasty  2006  . Back surgery  2004   No family history on file. History  Substance Use Topics  . Smoking status: Never Smoker   . Smokeless tobacco: Not on file  . Alcohol Use: No   OB History   Grav Para Term Preterm Abortions TAB SAB Ect Mult Living                 Review of Systems  Constitutional: Negative for appetite change and fatigue.  HENT: Negative for congestion, ear discharge and sinus pressure.   Eyes: Negative for discharge.  Respiratory: Negative for cough.   Cardiovascular: Negative for chest pain.  Gastrointestinal: Negative for abdominal pain and diarrhea.  Genitourinary: Negative for frequency and hematuria.  Musculoskeletal: Negative for back pain.  Skin: Negative for rash.  Neurological: Positive for headaches. Negative for seizures.       Slurred speech and facial numbness  Psychiatric/Behavioral: Negative for hallucinations.     Allergies  Penicillins and Sulfur  Home Medications   Current Outpatient Rx  Name  Route  Sig  Dispense  Refill  . acetaminophen (TYLENOL) 500 MG tablet   Oral   Take 500 mg by mouth every 6 (six) hours as needed for mild pain or fever.          Marland Kitchen albuterol (PROVENTIL HFA;VENTOLIN HFA) 108 (90 BASE) MCG/ACT inhaler   Inhalation   Inhale 1-2 puffs into the lungs every 6 (six) hours as needed for wheezing or shortness of breath.         Marland Kitchen atorvastatin (LIPITOR) 10 MG tablet   Oral   Take 10 mg by mouth daily.         . Fluticasone-Salmeterol (ADVAIR) 100-50 MCG/DOSE AEPB   Inhalation   Inhale 1 puff into the lungs every 12 (twelve) hours.         Marland Kitchen ibuprofen (ADVIL,MOTRIN) 200 MG tablet   Oral   Take 200 mg by mouth every 6 (six) hours as needed for moderate pain.         Marland Kitchen tiotropium (SPIRIVA) 18 MCG inhalation capsule   Inhalation   Place 18 mcg into inhaler and inhale daily.         Marland Kitchen levofloxacin (LEVAQUIN) 500 MG tablet   Oral   Take 1 tablet (500 mg total) by mouth daily.   10 tablet   0    BP 119/79  Pulse 80  Temp(Src) 98.7 F (37.1 C) (Oral)  Resp  21  Ht 5\' 2"  (1.575 m)  Wt 114 lb (51.71 kg)  BMI 20.85 kg/m2  SpO2 100% Physical Exam  Constitutional: She is oriented to person, place, and time. She appears well-developed.  HENT:  Head: Normocephalic.  Eyes: Conjunctivae and EOM are normal. No scleral icterus.  Neck: Neck supple. No thyromegaly present.  Cardiovascular: Normal rate and regular rhythm.  Exam reveals no gallop and no friction rub.   No murmur heard. Pulmonary/Chest: No stridor. She has no wheezes. She has no rales. She exhibits no tenderness.  Abdominal: She exhibits no distension. There is no tenderness. There is no rebound.  Musculoskeletal: Normal range of motion. She exhibits no edema.  Lymphadenopathy:    She has no cervical adenopathy.  Neurological: She is oriented to person, place, and time. She exhibits normal  muscle tone. Coordination normal.  Mild numbness right cheek  Skin: No rash noted. No erythema.  Psychiatric: She has a normal mood and affect. Her behavior is normal.    ED Course  Procedures (including critical care time) Labs Review Labs Reviewed  CBC WITH DIFFERENTIAL - Abnormal; Notable for the following:    WBC 15.3 (*)    Hemoglobin 11.6 (*)    HCT 35.2 (*)    Platelets 425 (*)    Neutro Abs 9.6 (*)    Lymphs Abs 4.7 (*)    All other components within normal limits  COMPREHENSIVE METABOLIC PANEL - Abnormal; Notable for the following:    Total Bilirubin <0.2 (*)    All other components within normal limits  SEDIMENTATION RATE - Abnormal; Notable for the following:    Sed Rate 33 (*)    All other components within normal limits  GLUCOSE, CAPILLARY  APTT  PROTIME-INR  C-REACTIVE PROTEIN   Imaging Review Ct Head Wo Contrast  01/04/2013   CLINICAL DATA:  Stroke here.  Slurred speech and headache.  EXAM: CT HEAD WITHOUT CONTRAST  TECHNIQUE: Contiguous axial images were obtained from the base of the skull through the vertex without intravenous contrast.  COMPARISON:  None.  FINDINGS: Skull and Sinuses:Extensive mucosal thickening in the imaged paranasal sinuses, especially the maxillary and ethmoids. There is diffuse retained frothy secretions with maxillary sinus effusions. Question previous medial maxillary antrostomies. Remote blowout fracture of the medial wall left orbit.  Orbits: No acute abnormality.  Brain: No evidence of acute abnormality, such as acute large territory infarction, hemorrhage, hydrocephalus, or mass lesion/mass effect. There is a small, fairly ill-defined area of low attenuation, sub lentiform on the left, measuring no more than 7 mm.  These results were called by telephone at the time of interpretation on 01/04/2013 at 10:29 AM to Dr. Amada Jupiter, who verbally acknowledged these results.  IMPRESSION: 1. No evidence of acute hemorrhage or large territory  infarct. 2. Small low-density near the low left sylvian fissure, favor dilated perivascular space over lacunar infarct. 3. Headache could be related to diffuse sinusitis, with acute features.   Electronically Signed   By: Tiburcio Pea M.D.   On: 01/04/2013 10:30   Mr Brain Wo Contrast  01/04/2013   CLINICAL DATA:  Right facial pain extending to the right forehead, temporal region and eye after blowing nose. Blurred vision right eye. Hypertension.  EXAM: MRI HEAD WITHOUT CONTRAST  TECHNIQUE: Multiplanar, multiecho pulse sequences of the brain and surrounding structures were obtained without intravenous contrast.  COMPARISON:  01/04/2013 head CT.  No comparison brain MR.  FINDINGS: No acute infarct.  Artifact extends through the mid brain.  No intracranial hemorrhage.  Minimal white matter type changes probably related to result of small vessel disease.  No hydrocephalus.  No intracranial mass lesion noted on this unenhanced exam.  Major intracranial vascular structures are patent.  Opacification ethmoid sinus air cells and maxillary sinuses bilaterally without evidence of intraorbital extension. Very mild frontal sinus and sphenoid sinus air cell mucosal thickening.  Mild partial opacification mastoid air cells without obstructing lesion noted in the region of the posterior superior nasopharynx.  Cervical medullary junction, pituitary region and pineal region as well as orbital structures unremarkable.  IMPRESSION: No acute infarct.  Opacification ethmoid sinus air cells and maxillary sinuses bilaterally without evidence of intraorbital extension. Very mild frontal sinus and sphenoid sinus air cell mucosal thickening.  Mild partial opacification mastoid air cells.  Please see above.   Electronically Signed   By: Bridgett Larsson M.D.   On: 01/04/2013 12:57    EKG Interpretation   None      Pt seen by neuro.  After normal mri,  Neuro states the pts symptoms are related to sinusitis MDM   1. Blurred  vision, right eye   2. Sinusitis       Benny Lennert, MD 01/04/13 [phone]

## 2013-01-04 NOTE — Code Documentation (Signed)
57yo female arriving to [facility] from Urgent [facility].  Patient reports that she woke up at 0200 and went to work at [employer].  At 0500 she had a sudden onset of R facial pain and numbness, blurred vision, and reports difficulty finding her words.  She feels that the pain has gradually become worse and is now a 6/10.  She reports a history of headaches.  CT completed.  NIHSS 1 for decreased sensation in right face and right leg.  She continues to report blurred vision in the right eye and pain in her right face.  Patient is outside the window for treatment with tPA.  No acute stroke treatment at this time per Dr. Amada Jupiter.  Bedside handoff with ED RN Maxine Glenn.

## 2013-01-04 NOTE — ED Notes (Signed)
Returned from CT scan with RRT nurse & D. Katrinka Blazing, [place].

## 2013-01-04 NOTE — ED Provider Notes (Signed)
CSN: [id number]     Arrival date & time 01/04/13  0818 History   None    Chief Complaint  Patient presents with  . Eye Pain  . Facial Pain   (Consider location/radiation/quality/duration/timing/severity/associated sxs/prior Treatment) HPI Comments: 57 year old female smoker with history significant for mitral valve prolapse and COPD presents complaining of right-sided facial numbness, intermittent blurry vision, and pain behind her right eye that began this morning. She also admits to having difficulty expressing words and having drooping of her right lip and jaw, drooling on herself, starting at 6:15 this morning. Finally, she admits to some lightheadedness, mild, that has been persistent up until now, and some mild right upper quadrant abdominal pains. She has a recent history of an upper respiratory infection that has resolved without treatment in the past 2 weeks. She has no history of atrial fibrillation, stroke, coronary artery disease. She has not had any nausea, vomiting, chest pain, palpitations. All of these symptoms she is here for today are very unusual for her, she has never experienced them before.  Patient is a 57 y.o. female presenting with eye pain.  Eye Pain Pertinent negatives include no chest pain, no abdominal pain and no shortness of breath.    Past Medical History  Diagnosis Date  . Asthma   . COPD (chronic obstructive pulmonary disease)   . Hypertension   . Bursitis   . Osteoporosis    Past Surgical History  Procedure Laterality Date  . Knee arthroplasty  2006  . Back surgery  2004   History reviewed. No pertinent family history. History  Substance Use Topics  . Smoking status: Never Smoker   . Smokeless tobacco: Not on file  . Alcohol Use: No   OB History   Grav Para Term Preterm Abortions TAB SAB Ect Mult Living                 Review of Systems  Constitutional: Negative for fever and chills.  HENT:       Drooling and inability to close mouth on  right  Eyes: Positive for pain and visual disturbance.  Respiratory: Negative for cough and shortness of breath.   Cardiovascular: Negative for chest pain, palpitations and leg swelling.  Gastrointestinal: Negative for nausea, vomiting and abdominal pain.  Endocrine: Negative for polydipsia and polyuria.  Genitourinary: Negative for dysuria, urgency and frequency.  Musculoskeletal: Negative for arthralgias and myalgias.  Skin: Negative for rash.  Neurological: Positive for facial asymmetry and speech difficulty. Negative for dizziness, weakness and light-headedness.    Allergies  Penicillins and Sulfur  Home Medications   Current Outpatient Rx  Name  Route  Sig  Dispense  Refill  . ALBUTEROL IN   Inhalation   Inhale into the lungs.         Marland Kitchen atorvastatin (LIPITOR) 10 MG tablet   Oral   Take 10 mg by mouth daily.         . Fluticasone-Salmeterol (ADVAIR) 100-50 MCG/DOSE AEPB   Inhalation   Inhale 1 puff into the lungs every 12 (twelve) hours.         Marland Kitchen acetaminophen (TYLENOL) 500 MG tablet   Oral   Take 500 mg by mouth every 6 (six) hours as needed.         Marland Kitchen alendronate (FOSAMAX) 70 MG tablet   Oral   Take 70 mg by mouth every 7 (seven) days. Take with a full glass of water on an empty stomach.         Marland Kitchen  PRESCRIPTION MEDICATION      Unknown BP med          BP 144/81  Pulse 75  Temp(Src) 98.6 F (37 C) (Oral)  Resp 16  SpO2 100% Physical Exam  Nursing note and vitals reviewed. Constitutional: She is oriented to person, place, and time. Vital signs are normal. She appears well-developed and well-nourished. No distress.  HENT:  Head: Normocephalic and atraumatic.  Pulmonary/Chest: Effort normal. No respiratory distress.  Neurological: She is alert and oriented to person, place, and time. She has normal strength. She is not disoriented. She displays abnormal reflex. A sensory deficit (slight decreased sensation of the face on the right, when directly  compared with the left) is present. No cranial nerve deficit. She exhibits normal muscle tone. She displays a negative Romberg sign. Coordination and gait normal.  Reflex Scores:      Patellar reflexes are 2+ on the right side and 4+ on the left side. Skin: Skin is warm and dry. No rash noted. She is not diaphoretic.  Psychiatric: She has a normal mood and affect. Judgment normal.    ED Course  Procedures (including critical care time) Labs Review Labs Reviewed - No data to display Imaging Review No results found.    MDM   1. Facial numbness   2. Blurry vision   3. Abnormal neurological exam    Physical exam is grossly normal with the exception of mild subjective decreased sensation in the right facial nerve distribution and hyperreflexive left patellar tendon reflex. There is also a possible very mild strength deficit in the left upper extremity. Given the severity of her symptoms described earlier with expressive aphasia and facial droop, now with very mild symptoms, I suspect she has had a TIA. However, she needs to be ruled out for stroke. Her symptoms began at 6:15, 3 hours minutes prior to exam. She is being transferred to the emergency department for further evaluation.    Graylon Good, PA-C 01/04/13 [phone]

## 2013-01-04 NOTE — ED Notes (Signed)
Patient transported to MRI 

## 2013-01-04 NOTE — ED Notes (Signed)
Reports blurred vision to right eye has improved but not back to normal. Area of decreased sensation to right side face less than before, now localized to just right side forehead. Also states pain to right temple area better.

## 2013-01-04 NOTE — ED Notes (Signed)
Called main lab, spoke to [place] [facility] to add on tests to blood already received in lab. Confirmed blue top present, will need to collect gold top tube for CRP

## 2013-01-26 ENCOUNTER — Ambulatory Visit
Admission: RE | Admit: 2013-01-26 | Discharge: 2013-01-26 | Disposition: A | Discharge status: Home / Self Care | Payer: 59 | Source: Ambulatory Visit

## 2013-01-26 DIAGNOSIS — Z1231 Encounter for screening mammogram for malignant neoplasm of breast: Secondary | ICD-10-CM

## 2013-03-29 ENCOUNTER — Other Ambulatory Visit: Payer: Self-pay | Admitting: Internal Medicine

## 2013-03-29 DIAGNOSIS — R1032 Left lower quadrant pain: Secondary | ICD-10-CM

## 2013-03-29 DIAGNOSIS — D72829 Elevated white blood cell count, unspecified: Secondary | ICD-10-CM

## 2013-04-05 ENCOUNTER — Other Ambulatory Visit: Payer: 59

## 2013-04-08 ENCOUNTER — Ambulatory Visit
Admission: RE | Admit: 2013-04-08 | Discharge: 2013-04-08 | Disposition: A | Discharge status: Home / Self Care | Payer: 59 | Source: Ambulatory Visit | Attending: Internal Medicine | Admitting: Internal Medicine

## 2013-04-08 DIAGNOSIS — R1032 Left lower quadrant pain: Secondary | ICD-10-CM

## 2013-04-08 DIAGNOSIS — D72829 Elevated white blood cell count, unspecified: Secondary | ICD-10-CM

## 2013-04-08 MED ORDER — IOHEXOL 300 MG/ML  SOLN
100.0000 mL | Freq: Once | INTRAMUSCULAR | Status: AC | PRN
  Administered 2013-04-08: 100 mL via INTRAVENOUS

## 2013-04-14 ENCOUNTER — Other Ambulatory Visit: Payer: Self-pay | Admitting: Internal Medicine

## 2013-04-14 DIAGNOSIS — N289 Disorder of kidney and ureter, unspecified: Secondary | ICD-10-CM

## 2013-04-19 ENCOUNTER — Ambulatory Visit
Admission: RE | Admit: 2013-04-19 | Discharge: 2013-04-19 | Disposition: A | Discharge status: Home / Self Care | Payer: 59 | Source: Ambulatory Visit | Attending: Internal Medicine | Admitting: Internal Medicine

## 2013-04-19 DIAGNOSIS — N289 Disorder of kidney and ureter, unspecified: Secondary | ICD-10-CM

## 2013-04-24 ENCOUNTER — Ambulatory Visit
Admission: RE | Admit: 2013-04-24 | Discharge: 2013-04-24 | Disposition: A | Discharge status: Home / Self Care | Payer: 59 | Source: Ambulatory Visit | Attending: Internal Medicine | Admitting: Internal Medicine

## 2013-04-24 MED ORDER — GADOBENATE DIMEGLUMINE 529 MG/ML IV SOLN
10.0000 mL | Freq: Once | INTRAVENOUS | Status: AC | PRN
  Administered 2013-04-24: 10 mL via INTRAVENOUS

## 2013-12-26 ENCOUNTER — Other Ambulatory Visit: Payer: Self-pay

## 2013-12-26 DIAGNOSIS — Z1231 Encounter for screening mammogram for malignant neoplasm of breast: Secondary | ICD-10-CM

## 2014-01-27 ENCOUNTER — Ambulatory Visit: Payer: 59

## 2014-02-02 ENCOUNTER — Ambulatory Visit
Admission: RE | Admit: 2014-02-02 | Discharge: 2014-02-02 | Disposition: A | Discharge status: Home / Self Care | Payer: 59 | Source: Ambulatory Visit

## 2014-02-02 DIAGNOSIS — Z1231 Encounter for screening mammogram for malignant neoplasm of breast: Secondary | ICD-10-CM

## 2014-02-03 ENCOUNTER — Other Ambulatory Visit: Payer: Self-pay | Admitting: Internal Medicine

## 2014-02-03 DIAGNOSIS — R928 Other abnormal and inconclusive findings on diagnostic imaging of breast: Secondary | ICD-10-CM

## 2014-02-13 ENCOUNTER — Ambulatory Visit
Admission: RE | Admit: 2014-02-13 | Discharge: 2014-02-13 | Disposition: A | Discharge status: Home / Self Care | Payer: 59 | Source: Ambulatory Visit | Attending: Internal Medicine | Admitting: Internal Medicine

## 2014-02-13 DIAGNOSIS — R928 Other abnormal and inconclusive findings on diagnostic imaging of breast: Secondary | ICD-10-CM

## 2014-11-24 IMAGING — CT CT HEAD W/O CM
1 series · 15 of 29 positions shown, 19 images · non-contrast
Comparison: None.

CLINICAL DATA: Stroke here.  Slurred speech and headache.

EXAM:
CT HEAD WITHOUT CONTRAST
TECHNIQUE: Contiguous axial images were obtained from the base of the skull
through the vertex without intravenous contrast.

[Series 2: head 5.0 h30s · axial · 0.41mm/px · z∈[-128,+2]mm · 15 of 29 slices shown, 19 images]
[im 2/29  brain]
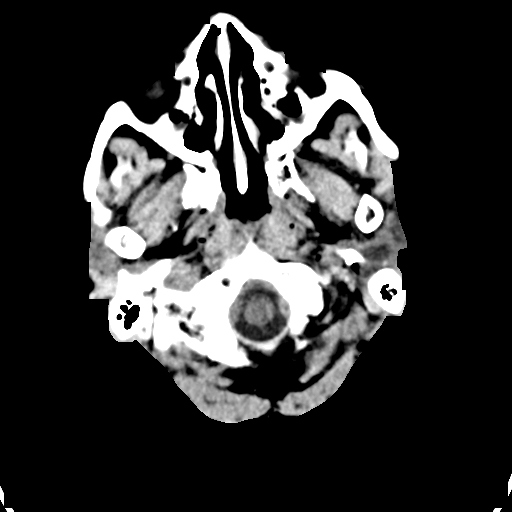
[im 2/29  bone]
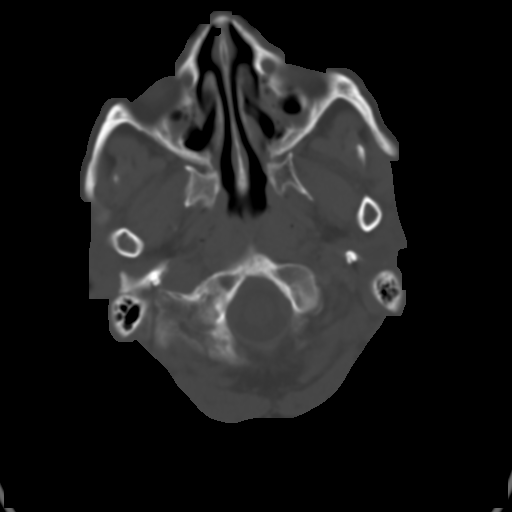
[im 4/29  brain]
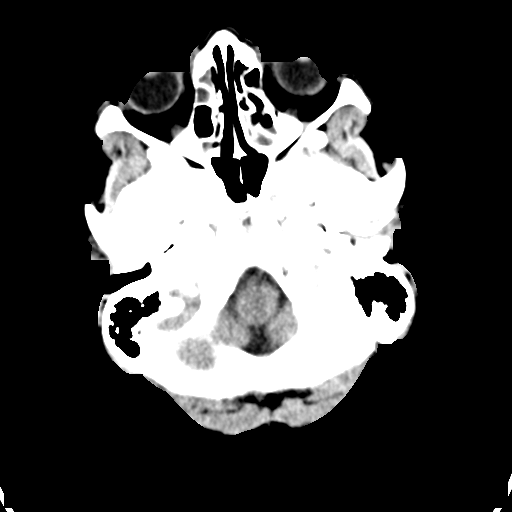
[im 6/29  brain]
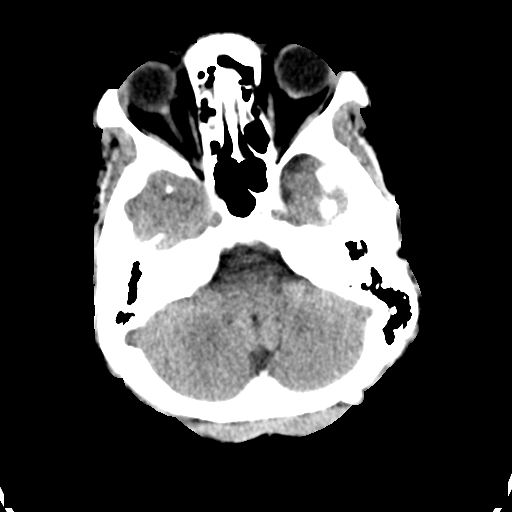
[im 8/29  brain]
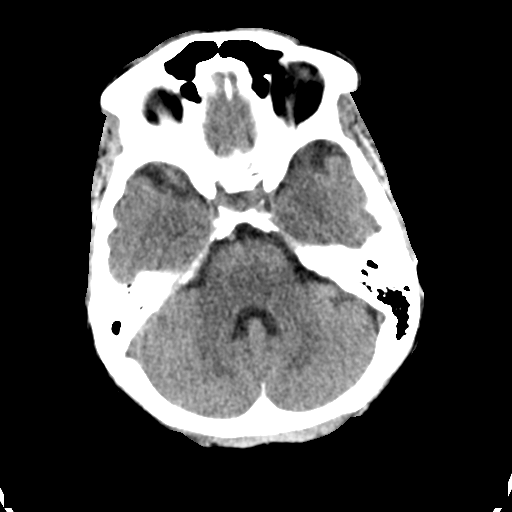
[im 10/29  brain]
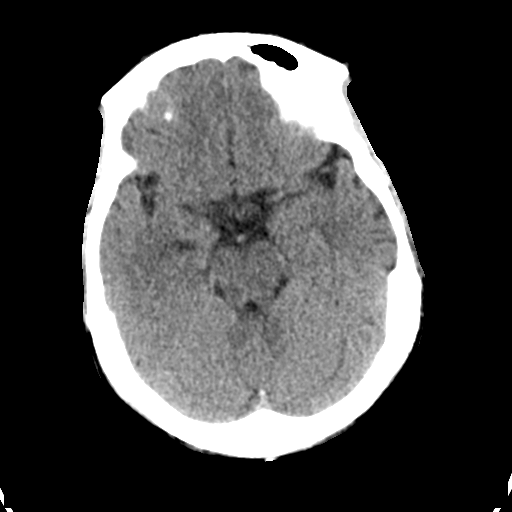
[im 10/29  bone]
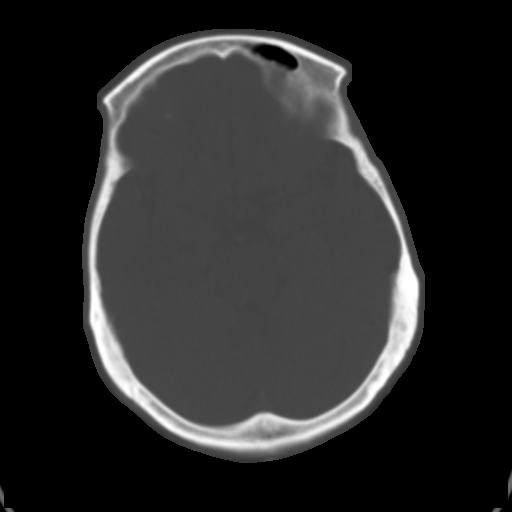
[im 11/29  brain]
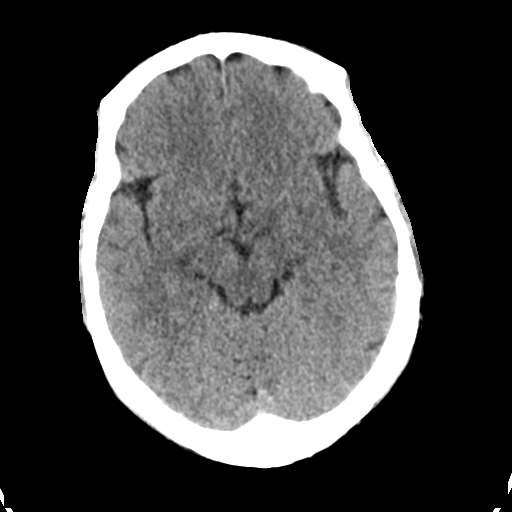
[im 13/29  brain]
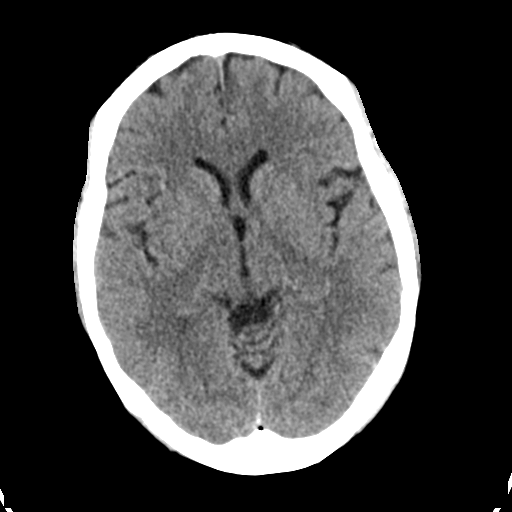
[im 15/29  brain]
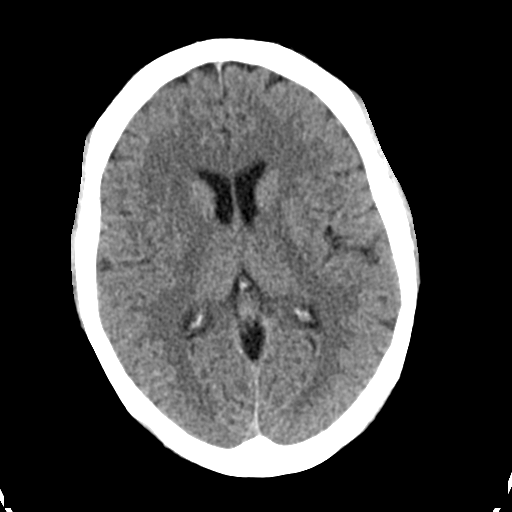
[im 17/29  brain]
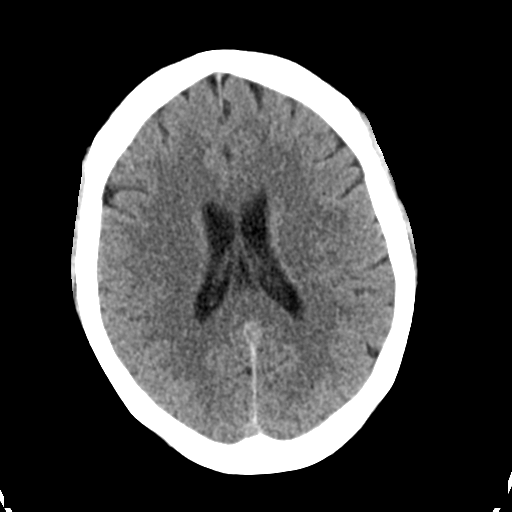
[im 17/29  bone]
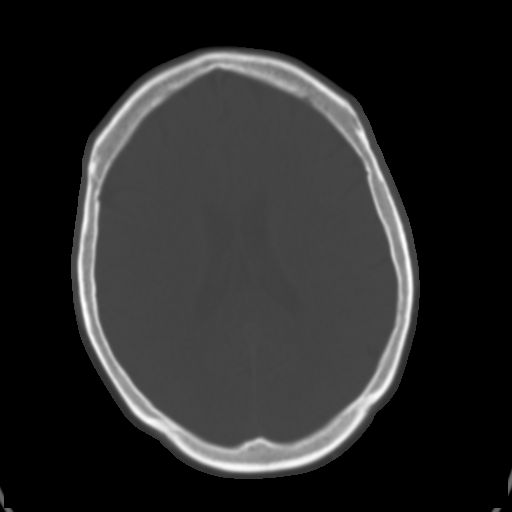
[im 19/29  brain]
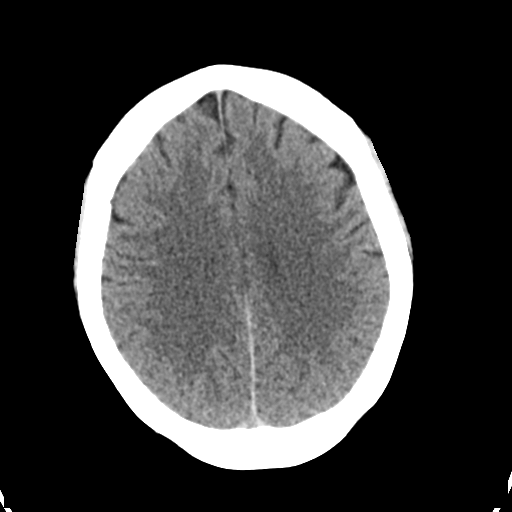
[im 20/29  brain]
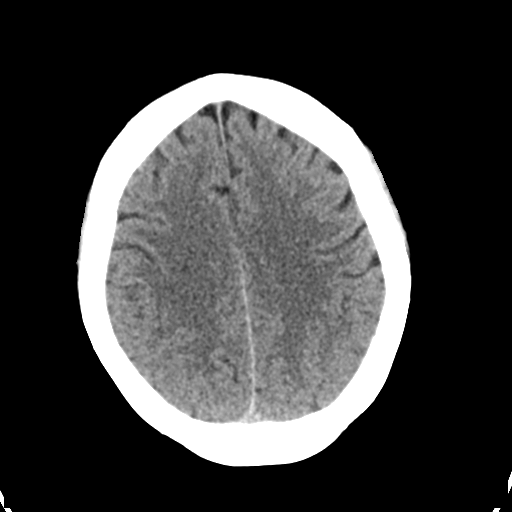
[im 22/29  brain]
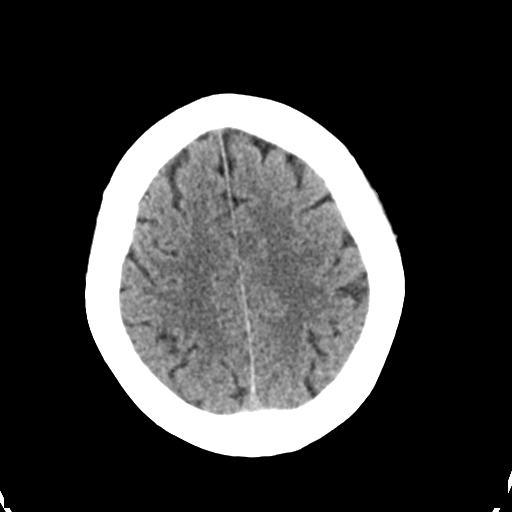
[im 24/29  brain]
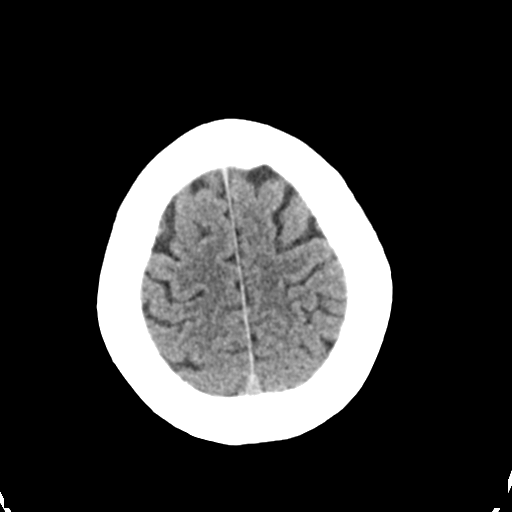
[im 24/29  bone]
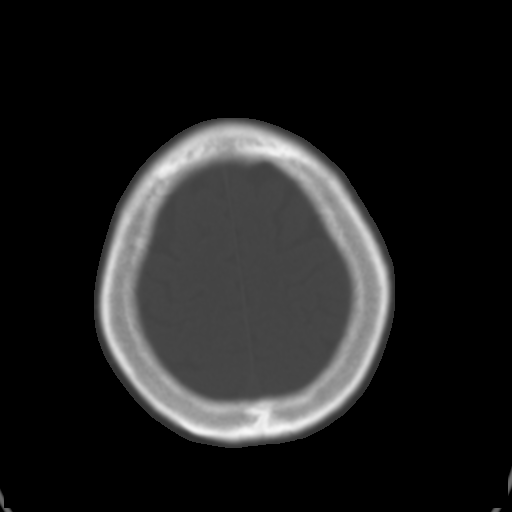
[im 26/29  brain]
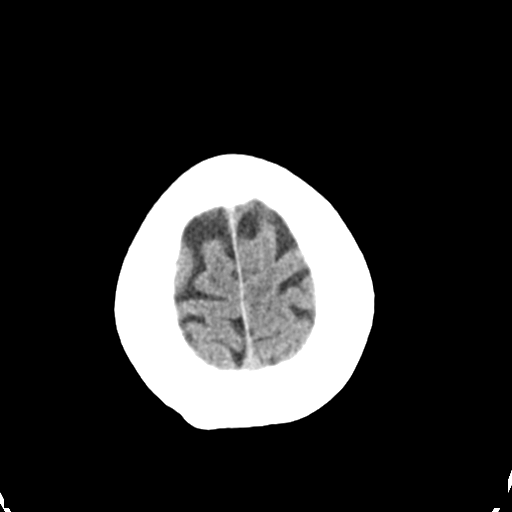
[im 28/29  brain]
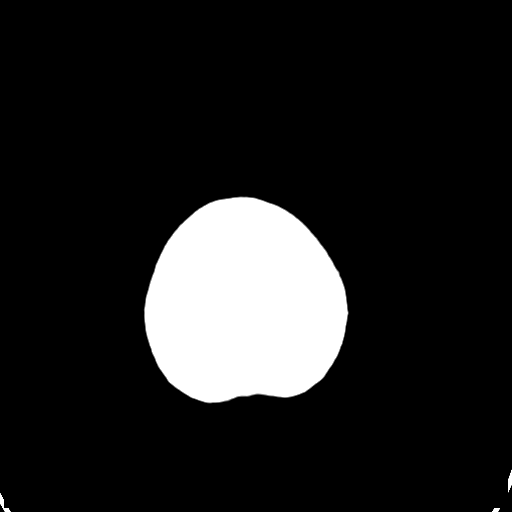

[15 of 29 positions shown; findings below may reference images not displayed]

FINDINGS: Skull and Sinuses:Extensive mucosal thickening in the imaged
paranasal sinuses, especially the maxillary and ethmoids. There is
diffuse retained frothy secretions with maxillary sinus effusions.
Question previous medial maxillary antrostomies. Remote blowout
fracture of the medial wall left orbit.

Orbits: No acute abnormality.

Brain: No evidence of acute abnormality, such as acute large
territory infarction, hemorrhage, hydrocephalus, or mass lesion/mass
effect. There is a small, fairly ill-defined area of low
attenuation, sub lentiform on the left, measuring no more than 7 mm.

These results were called by telephone at the time of interpretation
on 01/04/2013 at [DATE] to Dr. Artie, who verbally
acknowledged these results.
IMPRESSION: 1. No evidence of acute hemorrhage or large territory infarct.
2. Small low-density near the low left sylvian fissure, favor
dilated perivascular space over lacunar infarct.
3. Headache could be related to diffuse sinusitis, with acute
features.

## 2015-01-22 ENCOUNTER — Other Ambulatory Visit: Payer: Self-pay

## 2015-01-22 DIAGNOSIS — Z1231 Encounter for screening mammogram for malignant neoplasm of breast: Secondary | ICD-10-CM

## 2015-02-07 ENCOUNTER — Ambulatory Visit
Admission: RE | Admit: 2015-02-07 | Discharge: 2015-02-07 | Disposition: A | Discharge status: Home / Self Care | Payer: Commercial Managed Care - HMO | Source: Ambulatory Visit

## 2015-02-07 DIAGNOSIS — Z1231 Encounter for screening mammogram for malignant neoplasm of breast: Secondary | ICD-10-CM

## 2015-06-11 ENCOUNTER — Encounter: Payer: Self-pay | Admitting: Internal Medicine

## 2015-07-20 ENCOUNTER — Encounter: Payer: Self-pay | Admitting: Internal Medicine

## 2015-07-20 NOTE — Progress Notes (Signed)
This encounter was created in error - please disregard.

## 2015-08-06 ENCOUNTER — Encounter: Payer: Self-pay | Admitting: Internal Medicine

## 2015-08-06 ENCOUNTER — Ambulatory Visit (AMBULATORY_SURGERY_CENTER): Payer: Self-pay | Admitting: *Deleted

## 2015-08-06 VITALS — Ht 65.0 in | Wt 115.0 lb

## 2015-08-06 DIAGNOSIS — Z1211 Encounter for screening for malignant neoplasm of colon: Secondary | ICD-10-CM

## 2015-08-06 MED ORDER — NA SULFATE-K SULFATE-MG SULF 17.5-3.13-1.6 GM/177ML PO SOLN: 1.0000 | Freq: Once | ORAL | 0 refills | Status: AC

## 2015-08-06 NOTE — Progress Notes (Signed)
No egg or soy allergy known to patient  No issues with past sedation with any surgeries  or procedures, no intubation problems  No diet pills per patient No home 02 use per patient  No blood thinners per patient  Pt denies issues with constipation  emmi declined

## 2015-08-20 ENCOUNTER — Ambulatory Visit (AMBULATORY_SURGERY_CENTER): Payer: Commercial Managed Care - HMO | Admitting: Internal Medicine

## 2015-08-20 ENCOUNTER — Encounter: Payer: Self-pay | Admitting: Internal Medicine

## 2015-08-20 VITALS — BP 119/80 | HR 66 | Temp 98.4°F | Resp 14 | Ht 65.0 in | Wt 115.0 lb

## 2015-08-20 DIAGNOSIS — Z1211 Encounter for screening for malignant neoplasm of colon: Secondary | ICD-10-CM

## 2015-08-20 DIAGNOSIS — D128 Benign neoplasm of rectum: Secondary | ICD-10-CM

## 2015-08-20 DIAGNOSIS — D129 Benign neoplasm of anus and anal canal: Secondary | ICD-10-CM

## 2015-08-20 DIAGNOSIS — K621 Rectal polyp: Secondary | ICD-10-CM | POA: Diagnosis not present

## 2015-08-20 DIAGNOSIS — D122 Benign neoplasm of ascending colon: Secondary | ICD-10-CM

## 2015-08-20 MED ORDER — SODIUM CHLORIDE 0.9 % IV SOLN: 500.0000 mL | INTRAVENOUS | Status: DC

## 2015-08-20 NOTE — Progress Notes (Signed)
Called to room to assist during endoscopic procedure.  Patient ID and intended procedure confirmed with present staff. Received instructions for my participation in the procedure from the performing physician.  

## 2015-08-20 NOTE — Patient Instructions (Signed)
Impression/recommendations:  Polyps (handout given) Repeat colonoscopy pending pathology results. Avoid aspirin, ibuprofen, naproxen and other non-steroidal anti-inflammatory drugs for 2 weeks after polyp removal. May resume 09/04/15 if needed. Tylenol only until then.  YOU HAD AN ENDOSCOPIC PROCEDURE TODAY AT [facility]:   Refer to the procedure report that was given to you for any specific questions about what was found during the examination.  If the procedure report does not answer your questions, please call your gastroenterologist to clarify.  If you requested that your care partner not be given the details of your procedure findings, then the procedure report has been included in a sealed envelope for you to review at your convenience later.  YOU SHOULD EXPECT: Some feelings of bloating in the abdomen. Passage of more gas than usual.  Walking can help get rid of the air that was put into your GI tract during the procedure and reduce the bloating. If you had a lower endoscopy (such as a colonoscopy or flexible sigmoidoscopy) you may notice spotting of blood in your stool or on the toilet paper. If you underwent a bowel prep for your procedure, you may not have a normal bowel movement for a few days.  Please Note:  You might notice some irritation and congestion in your nose or some drainage.  This is from the oxygen used during your procedure.  There is no need for concern and it should clear up in a day or so.  SYMPTOMS TO REPORT IMMEDIATELY:   Following lower endoscopy (colonoscopy or flexible sigmoidoscopy):  Excessive amounts of blood in the stool  Significant tenderness or worsening of abdominal pains  Swelling of the abdomen that is new, acute  Fever of 100F or higher   For urgent or emergent issues, a gastroenterologist can be reached at any hour by calling [phone].   DIET: Your first meal following the procedure should be a small meal and then it is ok  to progress to your normal diet. Heavy or fried foods are harder to digest and may make you feel nauseous or bloated.  Likewise, meals heavy in dairy and vegetables can increase bloating.  Drink plenty of fluids but you should avoid alcoholic beverages for 24 hours.  ACTIVITY:  You should plan to take it easy for the rest of today and you should NOT DRIVE or use heavy machinery until tomorrow (because of the sedation medicines used during the test).    FOLLOW UP: Our staff will call the number listed on your records the next business day following your procedure to check on you and address any questions or concerns that you may have regarding the information given to you following your procedure. If we do not reach you, we will leave a message.  However, if you are feeling well and you are not experiencing any problems, there is no need to return our call.  We will assume that you have returned to your regular daily activities without incident.  If any biopsies were taken you will be contacted by phone or by letter within the next 1-3 weeks.  Please call us at [phone] if you have not heard about the biopsies in 3 weeks.    SIGNATURES/CONFIDENTIALITY: You and/or your care partner have signed paperwork which will be entered into your electronic medical record.  These signatures attest to the fact that that the information above on your After Visit Summary has been reviewed and is understood.  Full responsibility of the confidentiality  of this discharge information lies with you and/or your care-partner.

## 2015-08-20 NOTE — Op Note (Signed)
[facility] Patient Name: Julia Howard Procedure Date: 08/20/2015 9:15 AM MRN: [id number] Endoscopist: Jerene Bears , MD Age: 60 Referring MD:  Date of Birth: 11-24-1955 Gender: Female Account #: [id number] Procedure:                Colonoscopy Indications:              Screening for colorectal malignant neoplasm, Last                            colonoscopy 10 years ago Medicines:                Monitored Anesthesia Care Procedure:                Pre-Anesthesia Assessment:                           - Prior to the procedure, a History and Physical                            was performed, and patient medications and                            allergies were reviewed. The patient's tolerance of                            previous anesthesia was also reviewed. The risks                            and benefits of the procedure and the sedation                            options and risks were discussed with the patient.                            All questions were answered, and informed consent                            was obtained. Prior Anticoagulants: The patient has                            taken no previous anticoagulant or antiplatelet                            agents. ASA Grade Assessment: III - A patient with                            severe systemic disease. After reviewing the risks                            and benefits, the patient was deemed in                            satisfactory condition to undergo the procedure.  After obtaining informed consent, the colonoscope                            was passed under direct vision. Throughout the                            procedure, the patient's blood pressure, pulse, and                            oxygen saturations were monitored continuously. The                            Model PCF-H190L [id number]) scope was introduced                            through the anus and advanced  to the the cecum,                            identified by appendiceal orifice and ileocecal                            valve. The colonoscopy was performed without                            difficulty. The patient tolerated the procedure                            well. The quality of the bowel preparation was                            good. The ileocecal valve, appendiceal orifice, and                            rectum were photographed. Scope In: 9:37:49 AM Scope Out: 9:54:12 AM Scope Withdrawal Time: 0 hours 13 minutes 58 seconds  Total Procedure Duration: 0 hours 16 minutes 23 seconds  Findings:                 The digital rectal exam was normal.                           Three sessile and semi-pedunculated polyps were                            found in the ascending colon. The polyps were 4 to                            7 mm in size. These polyps were removed with a cold                            snare. Resection and retrieval were complete.                           Two sessile polyps were found in the rectum.  The                            polyps were 5 to 7 mm in size. These polyps were                            removed with a hot snare. Resection and retrieval                            were complete.                           The exam was otherwise without abnormality on                            direct and retroflexion views. Complications:            No immediate complications. Estimated Blood Loss:     Estimated blood loss was minimal. Impression:               - Three 4 to 7 mm polyps in the ascending colon,                            removed with a cold snare. Resected and retrieved.                           - Two 5 to 7 mm polyps in the rectum, removed with                            a hot snare. Resected and retrieved.                           - The examination was otherwise normal on direct                            and retroflexion views. Recommendation:            - Patient has a contact number available for                            emergencies. The signs and symptoms of potential                            delayed complications were discussed with the                            patient. Return to normal activities tomorrow.                            Written discharge instructions were provided to the                            patient.                           - Resume  previous diet.                           - Continue present medications.                           - Avoid aspirin, ibuprofen, naproxen, or other                            non-steroidal anti-inflammatory drugs for 2 weeks                            after polyp removal.                           - Await pathology results.                           - Repeat colonoscopy is recommended. The                            colonoscopy date will be determined after pathology                            results from today's exam become available for                            review. Jerene Bears, MD 08/20/2015 10:02:41 AM This report has been signed electronically.

## 2015-08-21 ENCOUNTER — Telehealth: Payer: Self-pay | Admitting: *Deleted

## 2015-08-21 NOTE — Telephone Encounter (Signed)
  Follow up Call-  Call back number 08/20/2015  Post procedure Call Back phone  # [phone]  Permission to leave phone message Yes  Some recent data might be hidden     Patient questions:  Do you have a fever, pain , or abdominal swelling? No. Pain Score  0 *  Have you tolerated food without any problems? Yes.    Have you been able to return to your normal activities? Yes.    Do you have any questions about your discharge instructions: Diet   No. Medications  No. Follow up visit  No.  Do you have questions or concerns about your Care? No.  Actions: * If pain score is 4 or above: No action needed, pain <4.

## 2015-08-23 ENCOUNTER — Encounter: Payer: Self-pay | Admitting: Internal Medicine

## 2016-01-03 ENCOUNTER — Other Ambulatory Visit: Payer: Self-pay | Admitting: Internal Medicine

## 2016-01-03 DIAGNOSIS — Z1231 Encounter for screening mammogram for malignant neoplasm of breast: Secondary | ICD-10-CM

## 2016-02-08 ENCOUNTER — Ambulatory Visit
Admission: RE | Admit: 2016-02-08 | Discharge: 2016-02-08 | Disposition: A | Discharge status: Home / Self Care | Payer: Commercial Managed Care - HMO | Source: Ambulatory Visit | Attending: Internal Medicine | Admitting: Internal Medicine

## 2016-02-08 DIAGNOSIS — Z1231 Encounter for screening mammogram for malignant neoplasm of breast: Secondary | ICD-10-CM | POA: Diagnosis not present

## 2016-02-11 ENCOUNTER — Other Ambulatory Visit: Payer: Self-pay | Admitting: Internal Medicine

## 2016-02-11 DIAGNOSIS — R928 Other abnormal and inconclusive findings on diagnostic imaging of breast: Secondary | ICD-10-CM

## 2016-02-15 ENCOUNTER — Ambulatory Visit
Admission: RE | Admit: 2016-02-15 | Discharge: 2016-02-15 | Disposition: A | Discharge status: Home / Self Care | Payer: Commercial Managed Care - HMO | Source: Ambulatory Visit | Attending: Internal Medicine | Admitting: Internal Medicine

## 2016-02-15 DIAGNOSIS — R928 Other abnormal and inconclusive findings on diagnostic imaging of breast: Secondary | ICD-10-CM

## 2016-02-15 DIAGNOSIS — N6322 Unspecified lump in the left breast, upper inner quadrant: Secondary | ICD-10-CM | POA: Diagnosis not present

## 2016-04-10 ENCOUNTER — Ambulatory Visit: Payer: Self-pay | Admitting: Podiatry

## 2016-04-14 ENCOUNTER — Encounter: Payer: Self-pay | Admitting: Podiatry

## 2016-05-05 NOTE — Progress Notes (Signed)
This encounter was created in error - please disregard.

## 2016-06-03 DIAGNOSIS — Z0000 Encounter for general adult medical examination without abnormal findings: Secondary | ICD-10-CM | POA: Diagnosis not present

## 2016-06-10 DIAGNOSIS — E784 Other hyperlipidemia: Secondary | ICD-10-CM | POA: Diagnosis not present

## 2016-06-10 DIAGNOSIS — J45909 Unspecified asthma, uncomplicated: Secondary | ICD-10-CM | POA: Diagnosis not present

## 2016-06-10 DIAGNOSIS — J449 Chronic obstructive pulmonary disease, unspecified: Secondary | ICD-10-CM | POA: Diagnosis not present

## 2016-06-10 DIAGNOSIS — Z0000 Encounter for general adult medical examination without abnormal findings: Secondary | ICD-10-CM | POA: Diagnosis not present

## 2016-06-10 DIAGNOSIS — Z23 Encounter for immunization: Secondary | ICD-10-CM | POA: Diagnosis not present

## 2016-06-10 DIAGNOSIS — Z1389 Encounter for screening for other disorder: Secondary | ICD-10-CM | POA: Diagnosis not present

## 2016-06-25 ENCOUNTER — Ambulatory Visit (INDEPENDENT_AMBULATORY_CARE_PROVIDER_SITE_OTHER): Payer: 59

## 2016-06-25 ENCOUNTER — Encounter (INDEPENDENT_AMBULATORY_CARE_PROVIDER_SITE_OTHER): Payer: Self-pay | Admitting: Orthopaedic Surgery

## 2016-06-25 ENCOUNTER — Ambulatory Visit (INDEPENDENT_AMBULATORY_CARE_PROVIDER_SITE_OTHER): Payer: 59 | Admitting: Physician Assistant

## 2016-06-25 VITALS — Ht 65.0 in | Wt 115.0 lb

## 2016-06-25 DIAGNOSIS — M5442 Lumbago with sciatica, left side: Secondary | ICD-10-CM | POA: Diagnosis not present

## 2016-06-25 DIAGNOSIS — G8929 Other chronic pain: Secondary | ICD-10-CM

## 2016-06-25 DIAGNOSIS — M7062 Trochanteric bursitis, left hip: Secondary | ICD-10-CM

## 2016-06-25 MED ORDER — LIDOCAINE HCL 1 % IJ SOLN
3.0000 mL | INTRAMUSCULAR | Status: AC | PRN
  Administered 2016-06-25: 3 mL

## 2016-06-25 MED ORDER — METHYLPREDNISOLONE ACETATE 40 MG/ML IJ SUSP
40.0000 mg | INTRAMUSCULAR | Status: AC | PRN
  Administered 2016-06-25: 40 mg via INTRA_ARTICULAR
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
## 2016-06-25 NOTE — Progress Notes (Signed)
   Procedure Note  Patient: Julia Howard             Date of Birth: 1955/03/30           MRN: [id number]             Visit Date: 06/25/2016  Procedures: Visit Diagnoses: Chronic left-sided low back pain with left-sided sciatica - Plan: XR Lumbar Spine 2-3 Views  Trochanteric bursitis, left hip  Large Joint Inj Date/Time: 06/25/2016 10:12 AM Performed by: Pete Pelt Authorized by: Pete Pelt   Consent Given by:  Patient Indications:  Pain Location:  Hip Site:  L greater trochanter Needle Size:  25 G Needle Length:  1.5 inches Approach:  Lateral Ultrasound Guidance: No   Fluoroscopic Guidance: No   Arthrogram: No   Medications:  3 mL lidocaine 1 %; 40 mg methylPREDNISolone acetate 40 MG/ML Aspiration Attempted: No   Patient tolerance:  Patient tolerated the procedure well with no immediate complications

## 2016-07-10 ENCOUNTER — Ambulatory Visit (INDEPENDENT_AMBULATORY_CARE_PROVIDER_SITE_OTHER): Payer: 59 | Admitting: Orthopaedic Surgery

## 2016-07-10 DIAGNOSIS — M25552 Pain in left hip: Secondary | ICD-10-CM | POA: Diagnosis not present

## 2016-07-10 NOTE — Progress Notes (Signed)
The patient is following up after injection several weeks ago for trochanteric bursitis her left hip. She's also been treated for some low back pain. He says she is doing well and injection helped a lot. She is also been on meloxicam.  On exam she is negative straight leg raise left side. She has excellent strength in bilateral lower extremity she is a little bit of pain over the trochanteric area left hip but is not severe.  At this point she'll still work on strengthening and stretching exercises but otherwise will follow up as needed if she is doing so well. All questions were encouraged and answered.

## 2016-07-25 DIAGNOSIS — Z23 Encounter for immunization: Secondary | ICD-10-CM | POA: Diagnosis not present

## 2016-09-22 DIAGNOSIS — J0140 Acute pansinusitis, unspecified: Secondary | ICD-10-CM | POA: Diagnosis not present

## 2016-09-22 DIAGNOSIS — J209 Acute bronchitis, unspecified: Secondary | ICD-10-CM | POA: Diagnosis not present

## 2016-09-22 DIAGNOSIS — Z23 Encounter for immunization: Secondary | ICD-10-CM | POA: Diagnosis not present

## 2016-10-14 DIAGNOSIS — Z23 Encounter for immunization: Secondary | ICD-10-CM | POA: Diagnosis not present

## 2017-01-07 ENCOUNTER — Other Ambulatory Visit: Payer: Self-pay | Admitting: Internal Medicine

## 2017-01-07 DIAGNOSIS — Z1231 Encounter for screening mammogram for malignant neoplasm of breast: Secondary | ICD-10-CM

## 2017-01-19 ENCOUNTER — Encounter (INDEPENDENT_AMBULATORY_CARE_PROVIDER_SITE_OTHER): Payer: Self-pay | Admitting: Physician Assistant

## 2017-01-19 ENCOUNTER — Ambulatory Visit (INDEPENDENT_AMBULATORY_CARE_PROVIDER_SITE_OTHER): Payer: 59 | Admitting: Physician Assistant

## 2017-01-19 VITALS — Ht 63.0 in | Wt 109.0 lb

## 2017-01-19 DIAGNOSIS — M7662 Achilles tendinitis, left leg: Secondary | ICD-10-CM

## 2017-01-19 DIAGNOSIS — M7062 Trochanteric bursitis, left hip: Secondary | ICD-10-CM | POA: Diagnosis not present

## 2017-01-19 NOTE — Progress Notes (Signed)
Office Visit Note   Patient: Julia Howard           Date of Birth: Dec 11, 1955           MRN: [id number] Visit Date: 01/19/2017              Requested by: Marton Redwood, MD [address] [place], [place] [address] PCP: Marton Redwood, MD   Assessment & Plan: Visit Diagnoses:  1. Trochanteric bursitis, left hip   2. Achilles tendinitis, left leg     Plan: We will send her to physical therapy for IT band stretching, gastrocsoleus stretching, modalities home exercise program.  She will follow with [place] in a month check progress lack of.  Did give her also handout on some back stretches and gastroc / soleus and hamstring stretches.  She is to discontinue her Mobic while she is on the Medrol Dosepak then she can resume this.  Questions encouraged and answered at length.  9/16 inch heel lifts were placed in both shoes.  Follow-Up Instructions: Return in about 4 weeks (around 02/16/2017).   Orders:  No orders of the defined types were placed in this encounter.  No orders of the defined types were placed in this encounter.     Procedures: No procedures performed   Clinical Data: No additional findings.   Subjective: Chief Complaint  Patient presents with  . Lower Back - Pain  . Left Hip - Pain    HPI Julia Howard comes in today due to some low back pain left hip pain and left lower leg pain.  She was last seen this past summer for left trochanteric bursitis and got good relief with the injection.  She is having no numbness tingling down either leg.  She has some tenderness in her lower leg and calf and she states the pain radiates back up the leg.  Pain in her lower leg and calf is causing her to have some waking pain.  She is having no waking pain due to the back.  She is taking meloxicam and Tylenol without any real relief.  She denies any shortness of breath or chest pain.  States the left hip feels the same as it did before the injection.  She has been doing IT band  stretches as shown. Review of Systems  Please see HPI otherwise negative Objective: Vital Signs: Ht 5\' 3"  (1.6 m)   Wt 109 lb (49.4 kg)   BMI 19.31 kg/m   Physical Exam  Constitutional: She is oriented to person, place, and time. She appears well-developed and well-nourished. No distress.  Cardiovascular: Intact distal pulses.  Neurological: She is alert and oriented to person, place, and time.  Skin: Skin is warm and dry. She is not diaphoretic.  Psychiatric: She has a normal mood and affect. Her behavior is normal.    Ortho Exam Lumbar spine nontender over spinal column in the paraspinous region.  She lacks the last few degrees of forward flexion of lumbar spine.  She has good extension.  5 out of 5 strength throughout the lower extremities against resistance.  Deep tendon reflexes are 2+ at the knees and ankles and equal and symmetric bilaterally.  Negative straight leg raise bilaterally.  Calves are supple he has tenderness over the left proximal Achilles and down to the Achilles insertion.  No tenderness on the right.  There is no rashes skin lesions ulcerations erythema of either lower leg.  Tenderness over the left trochanteric bursa region.  Good range  of motion bilateral hips without significant pain. Specialty Comments:  No specialty comments available.  Imaging: No results found.   PMFS History: Patient Active Problem List   Diagnosis Date Noted  . Blurred vision, right eye 01/04/2013   Past Medical History:  Diagnosis Date  . Allergy   . Anemia   . Arthritis   . Asthma   . Bursitis   . COPD (chronic obstructive pulmonary disease) ([place])   . Hyperlipidemia   . Hypertension   . Neuromuscular disorder (HCC)    fibromyalgia  . Osteoporosis     Family History  Problem Relation Age of Onset  . Heart disease Mother   . Breast cancer Maternal Aunt   . Breast cancer Paternal Aunt   . Heart disease Maternal Grandmother   . Colon polyps Neg Hx   . Colon cancer Neg  Hx   . Esophageal cancer Neg Hx   . Rectal cancer Neg Hx   . Stomach cancer Neg Hx     Past Surgical History:  Procedure Laterality Date  . BACK SURGERY  2004  . COLONOSCOPY     > 10 years ago -[facility] imaging per pt and was normal  . KNEE ARTHROPLASTY  2006   Social History   Occupational History  . Not on file  Tobacco Use  . Smoking status: Never Smoker  . Smokeless tobacco: Never Used  Substance and Sexual Activity  . Alcohol use: No  . Drug use: No  . Sexual activity: Not Currently

## 2017-01-21 DIAGNOSIS — M79605 Pain in left leg: Secondary | ICD-10-CM | POA: Diagnosis not present

## 2017-01-21 DIAGNOSIS — M6281 Muscle weakness (generalized): Secondary | ICD-10-CM | POA: Diagnosis not present

## 2017-01-21 DIAGNOSIS — R262 Difficulty in walking, not elsewhere classified: Secondary | ICD-10-CM | POA: Diagnosis not present

## 2017-01-29 DIAGNOSIS — R262 Difficulty in walking, not elsewhere classified: Secondary | ICD-10-CM | POA: Diagnosis not present

## 2017-01-29 DIAGNOSIS — M6281 Muscle weakness (generalized): Secondary | ICD-10-CM | POA: Diagnosis not present

## 2017-01-29 DIAGNOSIS — M79605 Pain in left leg: Secondary | ICD-10-CM | POA: Diagnosis not present

## 2017-02-04 DIAGNOSIS — M79605 Pain in left leg: Secondary | ICD-10-CM | POA: Diagnosis not present

## 2017-02-04 DIAGNOSIS — R262 Difficulty in walking, not elsewhere classified: Secondary | ICD-10-CM | POA: Diagnosis not present

## 2017-02-04 DIAGNOSIS — M6281 Muscle weakness (generalized): Secondary | ICD-10-CM | POA: Diagnosis not present

## 2017-02-09 ENCOUNTER — Ambulatory Visit
Admission: RE | Admit: 2017-02-09 | Discharge: 2017-02-09 | Disposition: A | Discharge status: Home / Self Care | Payer: 59 | Source: Ambulatory Visit | Attending: Internal Medicine | Admitting: Internal Medicine

## 2017-02-09 DIAGNOSIS — Z1231 Encounter for screening mammogram for malignant neoplasm of breast: Secondary | ICD-10-CM

## 2017-02-11 DIAGNOSIS — R262 Difficulty in walking, not elsewhere classified: Secondary | ICD-10-CM | POA: Diagnosis not present

## 2017-02-11 DIAGNOSIS — M6281 Muscle weakness (generalized): Secondary | ICD-10-CM | POA: Diagnosis not present

## 2017-02-11 DIAGNOSIS — M79605 Pain in left leg: Secondary | ICD-10-CM | POA: Diagnosis not present

## 2017-02-16 ENCOUNTER — Ambulatory Visit (INDEPENDENT_AMBULATORY_CARE_PROVIDER_SITE_OTHER): Payer: 59 | Admitting: Physician Assistant

## 2017-02-16 ENCOUNTER — Encounter (INDEPENDENT_AMBULATORY_CARE_PROVIDER_SITE_OTHER): Payer: Self-pay | Admitting: Physician Assistant

## 2017-02-16 ENCOUNTER — Ambulatory Visit (INDEPENDENT_AMBULATORY_CARE_PROVIDER_SITE_OTHER): Payer: 59

## 2017-02-16 DIAGNOSIS — M79641 Pain in right hand: Secondary | ICD-10-CM

## 2017-02-16 DIAGNOSIS — M7062 Trochanteric bursitis, left hip: Secondary | ICD-10-CM | POA: Diagnosis not present

## 2017-02-16 DIAGNOSIS — M7662 Achilles tendinitis, left leg: Secondary | ICD-10-CM

## 2017-02-16 MED ORDER — METHYLPREDNISOLONE 4 MG PO TABS: ORAL_TABLET | ORAL | 0 refills | Status: DC

## 2017-02-16 NOTE — Progress Notes (Deleted)
+                                                                                                                                                                                                                                                                                                                                                                                                                                                                                                                                                                                                                                                                                                                                                                                                                                                                                                                                                                                                                                                                                                                                                                                                                                                                                                                                                                                                                                                                                                                                                                 Office Visit Note   Patient: Julia Howard           Date of Birth: 06-18-1955           MRN: [id number] Visit Date: 02/16/2017              Requested by: Marton Redwood, MD [address] [place], [place] [address] PCP: Marton Redwood, MD   Assessment & Plan: Visit Diagnoses:  1. Pain in right hand   2. Trochanteric bursitis, left hip   3. Achilles tendinitis, left leg     Plan: ***  Follow-Up Instructions: Return if symptoms worsen or fail to improve.   Orders:  Orders Placed This Encounter  Procedures  . XR Hand Complete Right   Meds ordered this encounter  Medications  . methylPREDNISolone (MEDROL) 4 MG tablet    Sig: Take as directed    Dispense:  21 tablet    Refill:  0      Procedures: No procedures performed   Clinical Data: No additional findings.   Subjective: Chief Complaint  Patient presents with  . Right Hand - Pain     HPI  Review of Systems   Objective: Vital Signs: There were no vitals taken for this visit.  Physical Exam  Ortho Exam  Specialty Comments:  No specialty comments available.  Imaging: No results found.   PMFS History: Patient Active Problem List   Diagnosis Date Noted  . Blurred vision, right eye 01/04/2013   Past Medical History:  Diagnosis Date  . Allergy   . Anemia   . Arthritis   . Asthma   . Bursitis   . COPD (chronic obstructive pulmonary disease) ([place])   . Hyperlipidemia   . Hypertension   . Neuromuscular disorder (HCC)    fibromyalgia  . Osteoporosis     Family History  Problem Relation Age of Onset  . Heart disease Mother   . Breast cancer Maternal Aunt   . Breast cancer Paternal Aunt   . Heart disease Maternal Grandmother   . Colon polyps Neg Hx   . Colon cancer Neg Hx   . Esophageal cancer Neg Hx   . Rectal cancer Neg Hx   . Stomach cancer Neg Hx     Past Surgical History:  Procedure Laterality Date  . BACK SURGERY  2004  . COLONOSCOPY     > 10 years ago -[facility] imaging per pt and was normal  . KNEE ARTHROPLASTY  2006   Social History   Occupational History  . Not on file  Tobacco Use  . Smoking status: Never Smoker  . Smokeless tobacco: Never Used  Substance and Sexual Activity  . Alcohol use: No  . Drug use: No  . Sexual activity: Not Currently

## 2017-02-16 NOTE — Progress Notes (Addendum)
Office Visit Note   Patient: Julia Howard           Date of Birth: Jan 21, 1955           MRN: [id number] Visit Date: 02/16/2017              Requested by: Marton Redwood, MD [address] [place], [place] [address] PCP: Marton Redwood, MD   Assessment & Plan: Visit Diagnoses:  1. Pain in right hand   2. Trochanteric bursitis, left hip   3. Achilles tendinitis, left leg     Plan: She will apply Voltaren gel to her right hand 2 g up to 4 times daily.  Medrol Dosepak for 6 days no other NSAIDs while on this.  She will finish up physical therapy for IT band stretching and also for gastrocsoleus stretching.  Follow-up with [place] on an as-needed basis.  Follow-Up Instructions: Return if symptoms worsen or fail to improve.   Orders:  Orders Placed This Encounter  Procedures  . XR Hand Complete Right   Meds ordered this encounter  Medications  . methylPREDNISolone (MEDROL) 4 MG tablet    Sig: Take as directed    Dispense:  21 tablet    Refill:  0      Procedures: No procedures performed   Clinical Data: No additional findings.   Subjective: Chief Complaint  Patient presents with  . Right Hand - Pain    HPI Julia Howard returns today follow-up of her left hip trochanteric bursitis and left Achilles tendinitis.  She feels therapy is definitely been beneficial for both of these.  She has some minimal pain over the left hip but no pain in the Achilles.  She has 1 more visit left with physical therapy.  She been taking mainly Tylenol.  She unfortunately did not get the Dosepak that was prescribed for her.  She comes in today with a new complaint of right hand pain the last 2 weeks worse with cold weather and gripping.  She has had no injury to the hand acutely. Review of Systems No fevers chills shortness of breath chest pain  Objective: Vital Signs: There were no vitals taken for this visit.  Physical Exam  Constitutional: She is oriented to person, place, and time.  She appears well-developed and well-nourished. No distress.  Cardiovascular: Intact distal pulses.  Pulmonary/Chest: Effort normal.  Neurological: She is alert and oriented to person, place, and time.  Skin: She is not diaphoretic.  Psychiatric: She has a normal mood and affect.    Ortho Exam Left hip trochanteric region with slight tenderness good range of motion hip otherwise.  Left Achilles intact nontender throughout.  Bilateral hand she has good range of motion of bilateral hands and full motor both hands sensation intact bilateral hands.  No rash skin lesions ulcerations erythema abnormal warmth.  Radial pulses are 2+ and equal and symmetric bilaterally.  She has some tenderness at the DIP joints into the right hand involving the index through fifth fingers.  Also tenderness at the metacarpal phalangeal joints second through fifth also.  No gross deformities of either hand.  Left hand nontender throughout.  Good range of motion bilateral wrists without pain.  Negative grind test right thumb. Specialty Comments:  No specialty comments available.  Imaging: Xr Hand Complete Right  Result Date: 02/16/2017 3 views right hand: No acute fractures.  There is some minimal degenerative changes involving the DIP joints second and fifth fingers.  Otherwise no bony abnormalities.  Wrist and carpal joints are all well maintained.    PMFS History: Patient Active Problem List   Diagnosis Date Noted  . Blurred vision, right eye 01/04/2013   Past Medical History:  Diagnosis Date  . Allergy   . Anemia   . Arthritis   . Asthma   . Bursitis   . COPD (chronic obstructive pulmonary disease) ([place])   . Hyperlipidemia   . Hypertension   . Neuromuscular disorder (HCC)    fibromyalgia  . Osteoporosis     Family History  Problem Relation Age of Onset  . Heart disease Mother   . Breast cancer Maternal Aunt   . Breast cancer Paternal Aunt   . Heart disease Maternal Grandmother   . Colon polyps  Neg Hx   . Colon cancer Neg Hx   . Esophageal cancer Neg Hx   . Rectal cancer Neg Hx   . Stomach cancer Neg Hx     Past Surgical History:  Procedure Laterality Date  . BACK SURGERY  2004  . COLONOSCOPY     > 10 years ago -[facility] imaging per pt and was normal  . KNEE ARTHROPLASTY  2006   Social History   Occupational History  . Not on file  Tobacco Use  . Smoking status: Never Smoker  . Smokeless tobacco: Never Used  Substance and Sexual Activity  . Alcohol use: No  . Drug use: No  . Sexual activity: Not Currently

## 2017-02-18 DIAGNOSIS — M79605 Pain in left leg: Secondary | ICD-10-CM | POA: Diagnosis not present

## 2017-02-18 DIAGNOSIS — M6281 Muscle weakness (generalized): Secondary | ICD-10-CM | POA: Diagnosis not present

## 2017-02-18 DIAGNOSIS — R262 Difficulty in walking, not elsewhere classified: Secondary | ICD-10-CM | POA: Diagnosis not present

## 2017-06-04 DIAGNOSIS — E7849 Other hyperlipidemia: Secondary | ICD-10-CM | POA: Diagnosis not present

## 2017-06-04 DIAGNOSIS — Z0000 Encounter for general adult medical examination without abnormal findings: Secondary | ICD-10-CM | POA: Diagnosis not present

## 2017-06-04 DIAGNOSIS — R82998 Other abnormal findings in urine: Secondary | ICD-10-CM | POA: Diagnosis not present

## 2017-06-04 DIAGNOSIS — M810 Age-related osteoporosis without current pathological fracture: Secondary | ICD-10-CM | POA: Diagnosis not present

## 2017-06-11 DIAGNOSIS — J45909 Unspecified asthma, uncomplicated: Secondary | ICD-10-CM | POA: Diagnosis not present

## 2017-06-11 DIAGNOSIS — J449 Chronic obstructive pulmonary disease, unspecified: Secondary | ICD-10-CM | POA: Diagnosis not present

## 2017-06-11 DIAGNOSIS — Z1389 Encounter for screening for other disorder: Secondary | ICD-10-CM | POA: Diagnosis not present

## 2017-06-11 DIAGNOSIS — E7849 Other hyperlipidemia: Secondary | ICD-10-CM | POA: Diagnosis not present

## 2017-06-11 DIAGNOSIS — Z0000 Encounter for general adult medical examination without abnormal findings: Secondary | ICD-10-CM | POA: Diagnosis not present

## 2017-06-12 DIAGNOSIS — Z1212 Encounter for screening for malignant neoplasm of rectum: Secondary | ICD-10-CM | POA: Diagnosis not present

## 2017-06-22 DIAGNOSIS — M810 Age-related osteoporosis without current pathological fracture: Secondary | ICD-10-CM | POA: Diagnosis not present

## 2017-08-31 ENCOUNTER — Ambulatory Visit: Payer: 59 | Admitting: Podiatry

## 2018-01-05 ENCOUNTER — Other Ambulatory Visit: Payer: Self-pay | Admitting: Internal Medicine

## 2018-01-05 DIAGNOSIS — Z1231 Encounter for screening mammogram for malignant neoplasm of breast: Secondary | ICD-10-CM

## 2018-02-12 ENCOUNTER — Ambulatory Visit
Admission: RE | Admit: 2018-02-12 | Discharge: 2018-02-12 | Disposition: A | Discharge status: Home / Self Care | Payer: 59 | Source: Ambulatory Visit | Attending: Internal Medicine | Admitting: Internal Medicine

## 2018-02-12 DIAGNOSIS — Z1231 Encounter for screening mammogram for malignant neoplasm of breast: Secondary | ICD-10-CM

## 2018-08-07 ENCOUNTER — Encounter: Payer: Self-pay | Admitting: Internal Medicine

## 2018-08-16 ENCOUNTER — Encounter: Payer: Self-pay | Admitting: Internal Medicine

## 2018-08-17 DIAGNOSIS — J45909 Unspecified asthma, uncomplicated: Secondary | ICD-10-CM | POA: Insufficient documentation

## 2018-08-17 DIAGNOSIS — G709 Myoneural disorder, unspecified: Secondary | ICD-10-CM | POA: Insufficient documentation

## 2018-08-17 DIAGNOSIS — I10 Essential (primary) hypertension: Secondary | ICD-10-CM | POA: Insufficient documentation

## 2018-08-17 DIAGNOSIS — J449 Chronic obstructive pulmonary disease, unspecified: Secondary | ICD-10-CM | POA: Insufficient documentation

## 2018-08-17 DIAGNOSIS — D649 Anemia, unspecified: Secondary | ICD-10-CM | POA: Insufficient documentation

## 2018-08-26 ENCOUNTER — Other Ambulatory Visit: Payer: Self-pay

## 2018-08-26 ENCOUNTER — Ambulatory Visit (AMBULATORY_SURGERY_CENTER): Payer: Self-pay | Admitting: *Deleted

## 2018-08-26 VITALS — Temp 97.3°F | Ht 62.0 in | Wt 110.0 lb

## 2018-08-26 DIAGNOSIS — G709 Myoneural disorder, unspecified: Secondary | ICD-10-CM

## 2018-08-26 DIAGNOSIS — Z86010 Personal history of colonic polyps: Secondary | ICD-10-CM

## 2018-08-26 MED ORDER — SUPREP BOWEL PREP KIT 17.5-3.13-1.6 GM/177ML PO SOLN: 1.0000 | Freq: Once | ORAL | 0 refills | Status: AC

## 2018-08-26 NOTE — Progress Notes (Signed)
No egg or soy allergy known to patient  No issues with past sedation with any surgeries  or procedures, no intubation problems  No diet pills per patient No home 02 use per patient  No blood thinners per patient  Pt denies issues with constipation  No A fib or A flutter  EMMI video sent to pt's e mail  

## 2018-09-09 ENCOUNTER — Other Ambulatory Visit: Payer: Self-pay

## 2018-09-09 ENCOUNTER — Ambulatory Visit: Payer: Medicaid Other | Admitting: Internal Medicine

## 2018-09-09 NOTE — Progress Notes (Signed)
Patient came to [facility] for scheduled procedure with Dr. Hilarie Fredrickson. Schedulers have tried informing this patient by leaving voicemails that Dr. Hilarie Fredrickson would be out and to try to reschedule or switch doctors. I did ask another MD and he gladly accepted to do her procedure at 2:00 today as all MDs are full this am. After asking the patient if she would like to do her procedure at 2:00, she declined and said she would reschedule at a later time. I did offer her a free prep when she came back.

## 2018-09-10 ENCOUNTER — Telehealth: Payer: Self-pay | Admitting: Internal Medicine

## 2018-09-10 MED ORDER — SUPREP BOWEL PREP KIT 17.5-3.13-1.6 GM/177ML PO SOLN: 1.0000 | Freq: Once | ORAL | 0 refills | Status: AC

## 2018-09-10 NOTE — Telephone Encounter (Signed)
Suprep sent to [facility] and new prep instructions sent to pt in the mail  Lelan Pons pV

## 2018-09-10 NOTE — Telephone Encounter (Signed)
Pt needs new prescription for prep sent to her pharmacy, she was not aware that her Colonoscopy with Dr. Hilarie Fredrickson had to be r/s so she showed up yesterday for proc and had already completed prep.

## 2018-09-14 NOTE — Progress Notes (Signed)
Noted We need to facilitate rescheduling for this patient JMP

## 2018-10-12 ENCOUNTER — Encounter: Payer: Self-pay | Admitting: Internal Medicine

## 2018-10-12 ENCOUNTER — Ambulatory Visit (AMBULATORY_SURGERY_CENTER): Payer: Medicaid Other | Admitting: Internal Medicine

## 2018-10-12 ENCOUNTER — Other Ambulatory Visit: Payer: Self-pay

## 2018-10-12 VITALS — BP 96/61 | HR 84 | Temp 98.5°F | Resp 19 | Ht 62.0 in | Wt 110.0 lb

## 2018-10-12 DIAGNOSIS — Z86010 Personal history of colonic polyps: Secondary | ICD-10-CM

## 2018-10-12 DIAGNOSIS — K635 Polyp of colon: Secondary | ICD-10-CM | POA: Diagnosis not present

## 2018-10-12 DIAGNOSIS — D123 Benign neoplasm of transverse colon: Secondary | ICD-10-CM

## 2018-10-12 MED ORDER — SODIUM CHLORIDE 0.9 % IV SOLN: 500.0000 mL | Freq: Once | INTRAVENOUS | Status: AC

## 2018-10-12 NOTE — Progress Notes (Signed)
Called to room to assist during endoscopic procedure.  Patient ID and intended procedure confirmed with present staff. Received instructions for my participation in the procedure from the performing physician.  

## 2018-10-12 NOTE — Progress Notes (Signed)
To PACU, VSS. Report to RN.tb 

## 2018-10-12 NOTE — Patient Instructions (Signed)
Please read handouts provided. Continue present medications. Await pathology results.        YOU HAD AN ENDOSCOPIC PROCEDURE TODAY AT THE [facility]:   Refer to the procedure report that was given to you for any specific questions about what was found during the examination.  If the procedure report does not answer your questions, please call your gastroenterologist to clarify.  If you requested that your care partner not be given the details of your procedure findings, then the procedure report has been included in a sealed envelope for you to review at your convenience later.  YOU SHOULD EXPECT: Some feelings of bloating in the abdomen. Passage of more gas than usual.  Walking can help get rid of the air that was put into your GI tract during the procedure and reduce the bloating. If you had a lower endoscopy (such as a colonoscopy or flexible sigmoidoscopy) you may notice spotting of blood in your stool or on the toilet paper. If you underwent a bowel prep for your procedure, you may not have a normal bowel movement for a few days.  Please Note:  You might notice some irritation and congestion in your nose or some drainage.  This is from the oxygen used during your procedure.  There is no need for concern and it should clear up in a day or so.  SYMPTOMS TO REPORT IMMEDIATELY:   Following lower endoscopy (colonoscopy or flexible sigmoidoscopy):  Excessive amounts of blood in the stool  Significant tenderness or worsening of abdominal pains  Swelling of the abdomen that is new, acute  Fever of 100F or higher    For urgent or emergent issues, a gastroenterologist can be reached at any hour by calling [phone].   DIET:  We do recommend a small meal at first, but then you may proceed to your regular diet.  Drink plenty of fluids but you should avoid alcoholic beverages for 24 hours.  ACTIVITY:  You should plan to take it easy for the rest of today and you should NOT  DRIVE or use heavy machinery until tomorrow (because of the sedation medicines used during the test).    FOLLOW UP: Our staff will call the number listed on your records 48-72 hours following your procedure to check on you and address any questions or concerns that you may have regarding the information given to you following your procedure. If we do not reach you, we will leave a message.  We will attempt to reach you two times.  During this call, we will ask if you have developed any symptoms of COVID 19. If you develop any symptoms (ie: fever, flu-like symptoms, shortness of breath, cough etc.) before then, please call [phone].  If you test positive for Covid 19 in the 2 weeks post procedure, please call and report this information to us.    If any biopsies were taken you will be contacted by phone or by letter within the next 1-3 weeks.  Please call us at [phone] if you have not heard about the biopsies in 3 weeks.    SIGNATURES/CONFIDENTIALITY: You and/or your care partner have signed paperwork which will be entered into your electronic medical record.  These signatures attest to the fact that that the information above on your After Visit Summary has been reviewed and is understood.  Full responsibility of the confidentiality of this discharge information lies with you and/or your care-partner. 

## 2018-10-12 NOTE — Op Note (Signed)
[facility] Patient Name: Julia Howard Procedure Date: 10/12/2018 8:29 AM MRN: [id number] Endoscopist: Jerene Bears , MD Age: 63 Referring MD:  Date of Birth: Oct 17, 1955 Gender: Female Account #: [id number] Procedure:                Colonoscopy Indications:              High risk colon cancer surveillance: Personal                            history of multiple (3 or more) adenomas, Last                            colonoscopy 3 years ago Medicines:                Monitored Anesthesia Care Procedure:                Pre-Anesthesia Assessment:                           - Prior to the procedure, a History and Physical                            was performed, and patient medications and                            allergies were reviewed. The patient's tolerance of                            previous anesthesia was also reviewed. The risks                            and benefits of the procedure and the sedation                            options and risks were discussed with the patient.                            All questions were answered, and informed consent                            was obtained. Prior Anticoagulants: The patient has                            taken no previous anticoagulant or antiplatelet                            agents. ASA Grade Assessment: II - A patient with                            mild systemic disease. After reviewing the risks                            and benefits, the patient was deemed in  satisfactory condition to undergo the procedure.                           After obtaining informed consent, the colonoscope                            was passed under direct vision. Throughout the                            procedure, the patient's blood pressure, pulse, and                            oxygen saturations were monitored continuously. The                            Colonoscope was introduced through the  anus and                            advanced to the cecum, identified by appendiceal                            orifice and ileocecal valve. The colonoscopy was                            performed without difficulty. The patient tolerated                            the procedure well. The quality of the bowel                            preparation was good (requiring irrigation and                            lavage). The ileocecal valve, appendiceal orifice,                            and rectum were photographed. Scope In: 8:37:57 AM Scope Out: 8:52:16 AM Scope Withdrawal Time: 0 hours 10 minutes 57 seconds  Total Procedure Duration: 0 hours 14 minutes 19 seconds  Findings:                 The digital rectal exam was normal.                           A 4 mm polyp was found in the transverse colon. The                            polyp was sessile. The polyp was removed with a                            cold snare. Resection and retrieval were complete.                           The exam was otherwise without abnormality on  direct and retroflexion views. Complications:            No immediate complications. Estimated Blood Loss:     Estimated blood loss: none. Impression:               - One 4 mm polyp in the transverse colon, removed                            with a cold snare. Resected and retrieved.                           - The examination was otherwise normal on direct                            and retroflexion views. Recommendation:           - Patient has a contact number available for                            emergencies. The signs and symptoms of potential                            delayed complications were discussed with the                            patient. Return to normal activities tomorrow.                            Written discharge instructions were provided to the                            patient.                           -  Resume previous diet.                           - Continue present medications.                           - Await pathology results.                           - Repeat colonoscopy is recommended for                            surveillance. The colonoscopy date will be                            determined after pathology results from today's                            exam become available for review. Jerene Bears, MD 10/12/2018 8:57:25 AM This report has been signed electronically.

## 2018-10-14 ENCOUNTER — Telehealth: Payer: Self-pay | Admitting: *Deleted

## 2018-10-14 ENCOUNTER — Telehealth: Payer: Self-pay

## 2018-10-14 NOTE — Telephone Encounter (Signed)
  Follow up Call-  Call back number 10/12/2018  Post procedure Call Back phone  # Gilman Buttner  [phone]  Permission to leave phone message Yes  Some recent data might be hidden     Patient questions:  Message left to call us if necessary.

## 2018-10-14 NOTE — Telephone Encounter (Signed)
Left message on 2nd follow up call. 

## 2018-10-15 ENCOUNTER — Encounter: Payer: Self-pay | Admitting: Internal Medicine

## 2019-01-11 ENCOUNTER — Other Ambulatory Visit: Payer: Self-pay | Admitting: Internal Medicine

## 2019-01-11 DIAGNOSIS — Z1231 Encounter for screening mammogram for malignant neoplasm of breast: Secondary | ICD-10-CM

## 2019-02-15 ENCOUNTER — Other Ambulatory Visit: Payer: Self-pay

## 2019-02-15 ENCOUNTER — Ambulatory Visit
Admission: RE | Admit: 2019-02-15 | Discharge: 2019-02-15 | Disposition: A | Discharge status: Home / Self Care | Payer: Medicaid Other | Source: Ambulatory Visit | Attending: Internal Medicine | Admitting: Internal Medicine

## 2019-02-15 DIAGNOSIS — Z1231 Encounter for screening mammogram for malignant neoplasm of breast: Secondary | ICD-10-CM

## 2019-04-16 ENCOUNTER — Ambulatory Visit: Payer: Medicaid Other | Attending: Internal Medicine

## 2019-04-16 DIAGNOSIS — Z23 Encounter for immunization: Secondary | ICD-10-CM

## 2019-04-16 NOTE — Progress Notes (Signed)
   Covid-19 Vaccination Clinic  Name:  Julia Howard    MRN: [id number] DOB: Sep 03, 1955  04/16/2019  [name] was observed post Covid-19 immunization for 15 minutes without incident. She was provided with Vaccine Information Sheet and instruction to access the V-Safe system.   [name] was instructed to call 911 with any severe reactions post vaccine: Marland Kitchen Difficulty breathing  . Swelling of face and throat  . A fast heartbeat  . A bad rash all over body  . Dizziness and weakness   Immunizations Administered    Name Date Dose VIS Date Route   Moderna COVID-19 Vaccine 04/16/2019 12:20 PM 0.5 mL 12/07/2018 Intramuscular   Manufacturer: Moderna   LotCA:[id number]   BrinckerhoffVO:[id number]

## 2019-05-14 ENCOUNTER — Ambulatory Visit: Payer: Medicaid Other | Attending: Internal Medicine

## 2019-05-14 DIAGNOSIS — Z23 Encounter for immunization: Secondary | ICD-10-CM

## 2019-05-14 NOTE — Progress Notes (Signed)
   Covid-19 Vaccination Clinic  Name:  Julia Howard    MRN: [id number] DOB: 29-Dec-1955  05/14/2019  [name] was observed post Covid-19 immunization for 15 minutes without incident. She was provided with Vaccine Information Sheet and instruction to access the V-Safe system.   [name] was instructed to call 911 with any severe reactions post vaccine: Marland Kitchen Difficulty breathing  . Swelling of face and throat  . A fast heartbeat  . A bad rash all over body  . Dizziness and weakness   Immunizations Administered    Name Date Dose VIS Date Route   Moderna COVID-19 Vaccine 05/14/2019  9:58 AM 0.5 mL 12/2018 Intramuscular   Manufacturer: Moderna   Lot: [id number]   [id number]

## 2019-12-06 ENCOUNTER — Ambulatory Visit: Payer: 59 | Admitting: Podiatry

## 2020-01-02 IMAGING — MG DIGITAL SCREENING BILATERAL MAMMOGRAM WITH TOMO AND CAD
8 series · 9 of 24 positions shown · non-contrast
Comparison: Previous exam(s).

CLINICAL DATA: Screening.

EXAM:
DIGITAL SCREENING BILATERAL MAMMOGRAM WITH TOMO AND CAD

[R CC synth-2D]
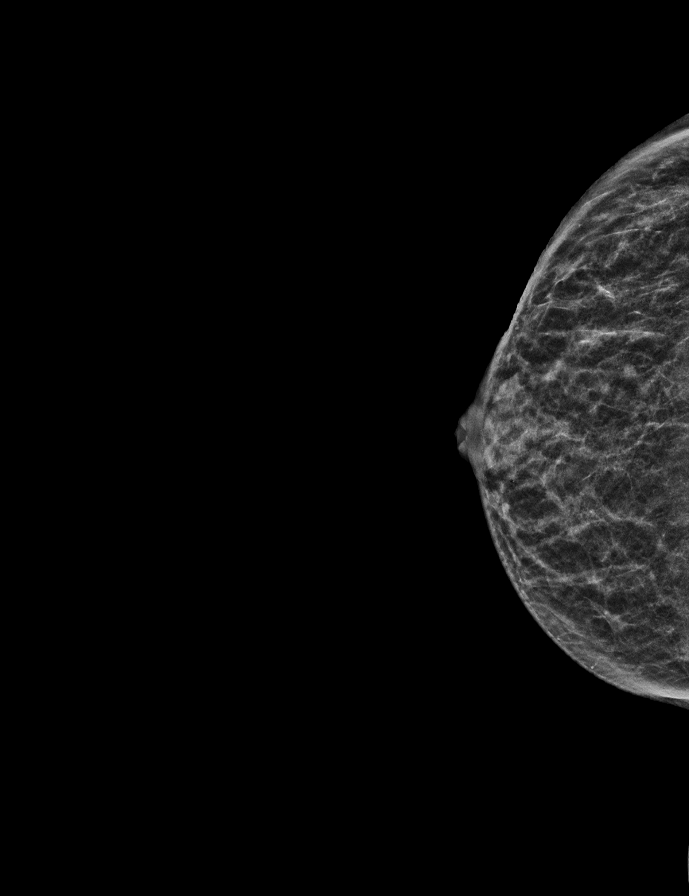

[L MLO synth-2D]
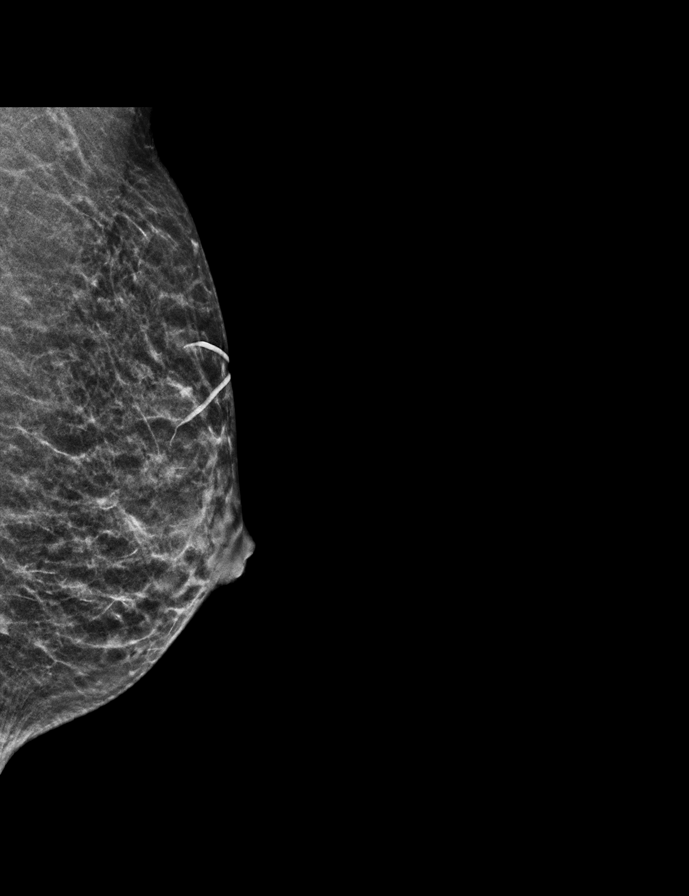

[R MLO synth-2D]
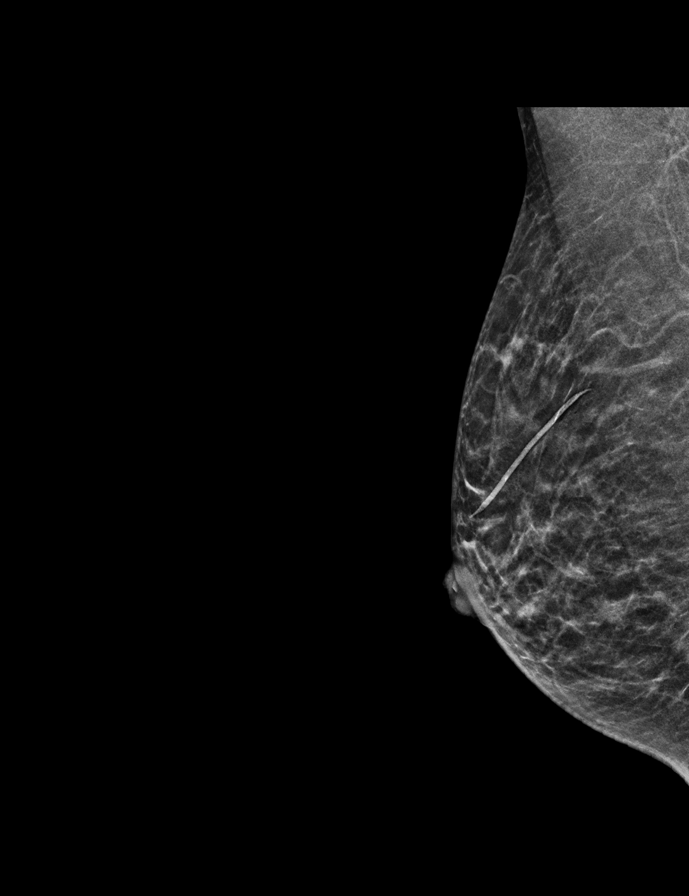

[L CC synth-2D]
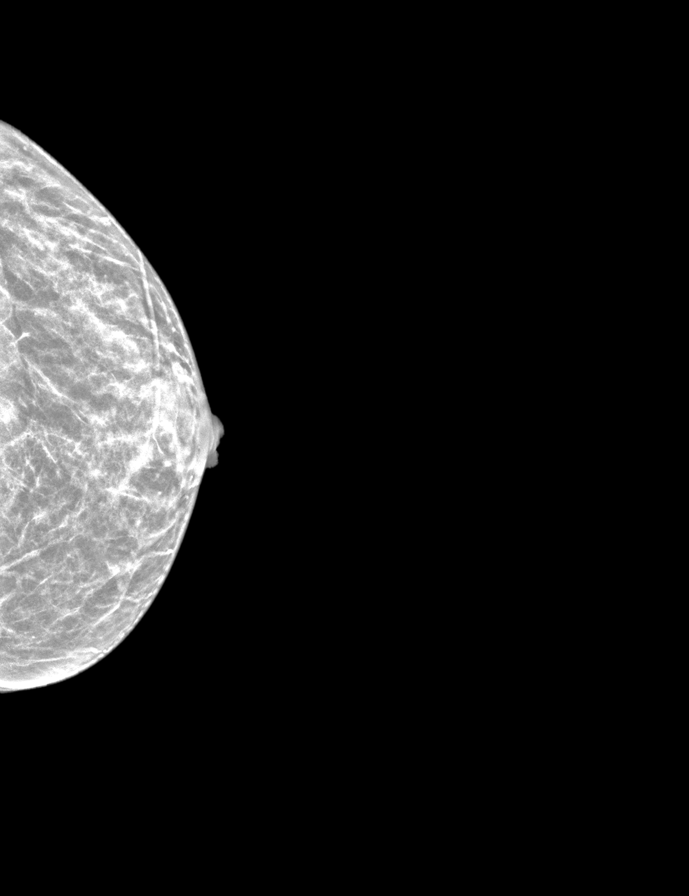

[L MLO tomo · 2 of 35 frames shown]
[frame 12/35]
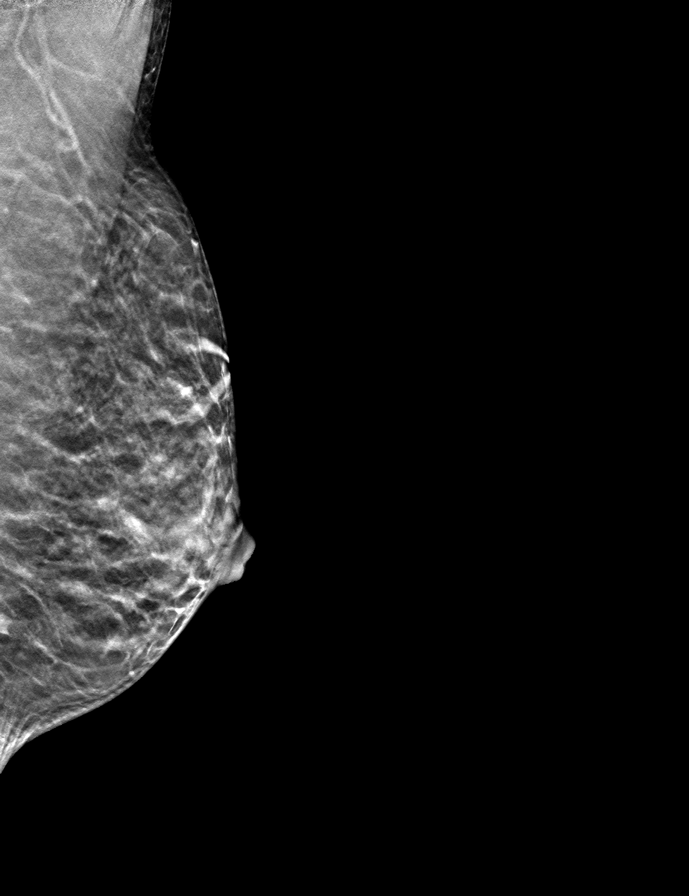
[frame 18/35]
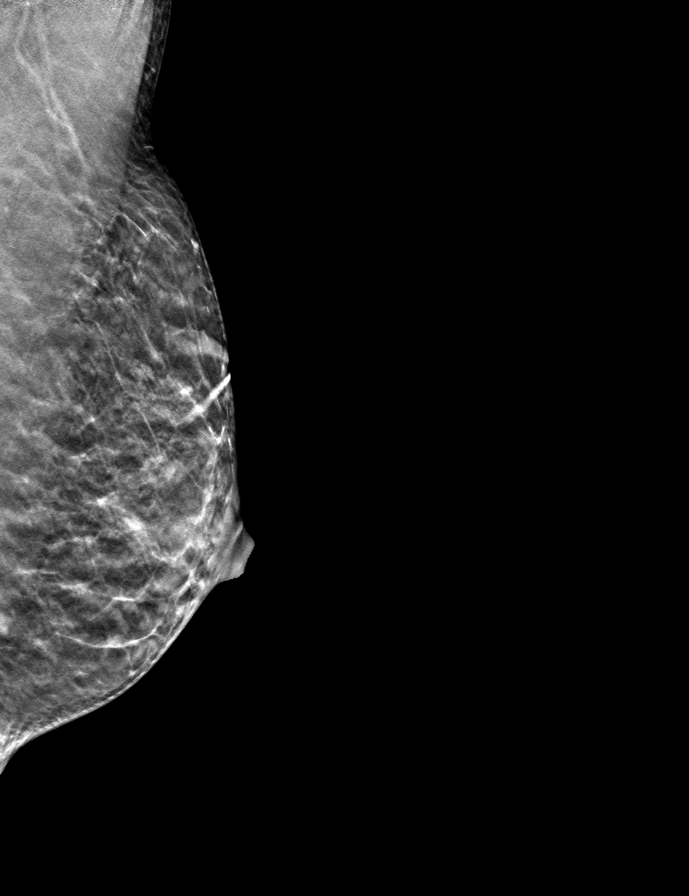

[R MLO tomo · tomo slice 18/35.0]
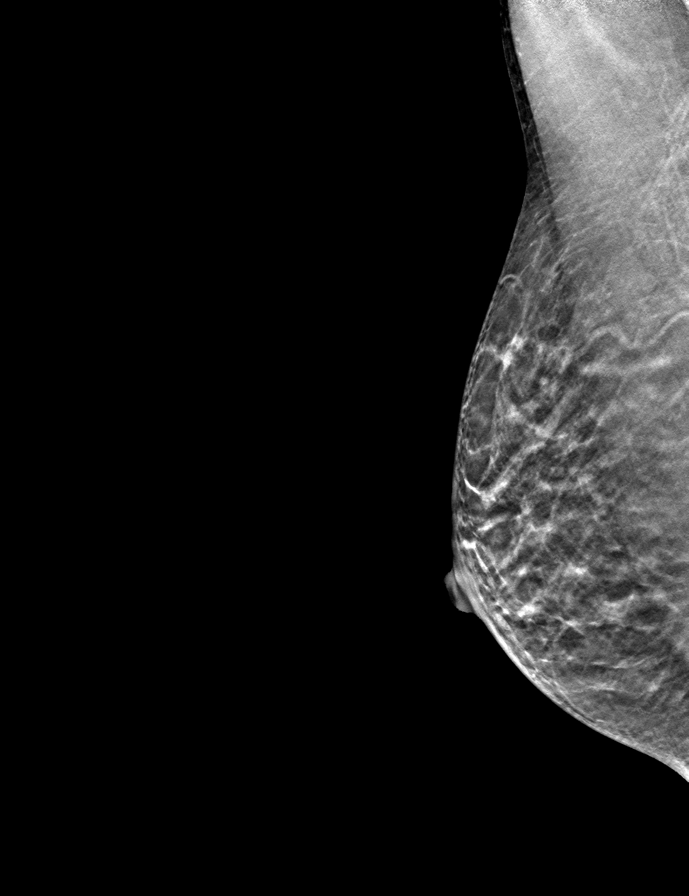

[L CC tomo · tomo slice 21/40.0]
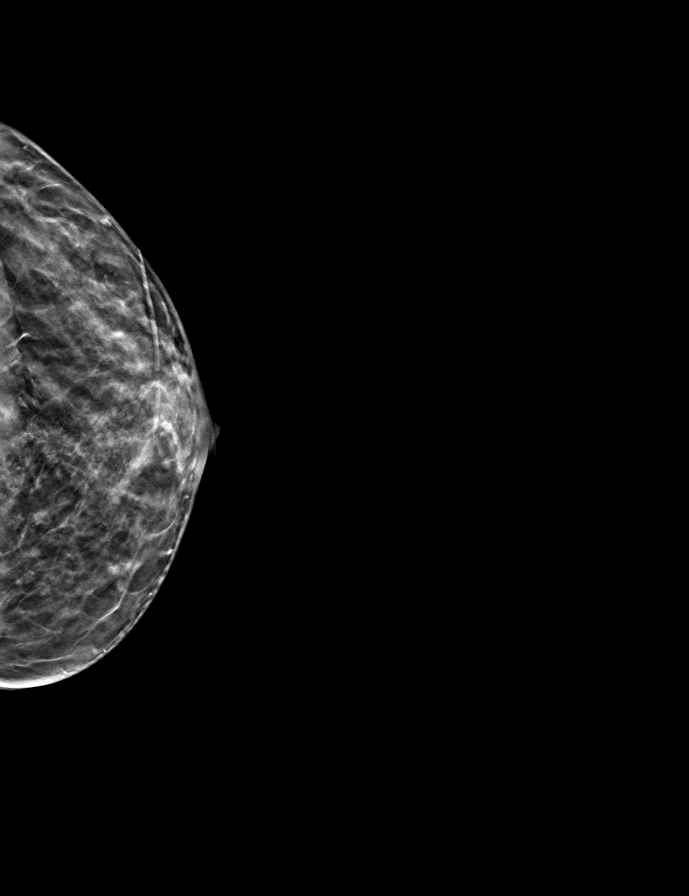

[R CC tomo · tomo slice 17/34.0]
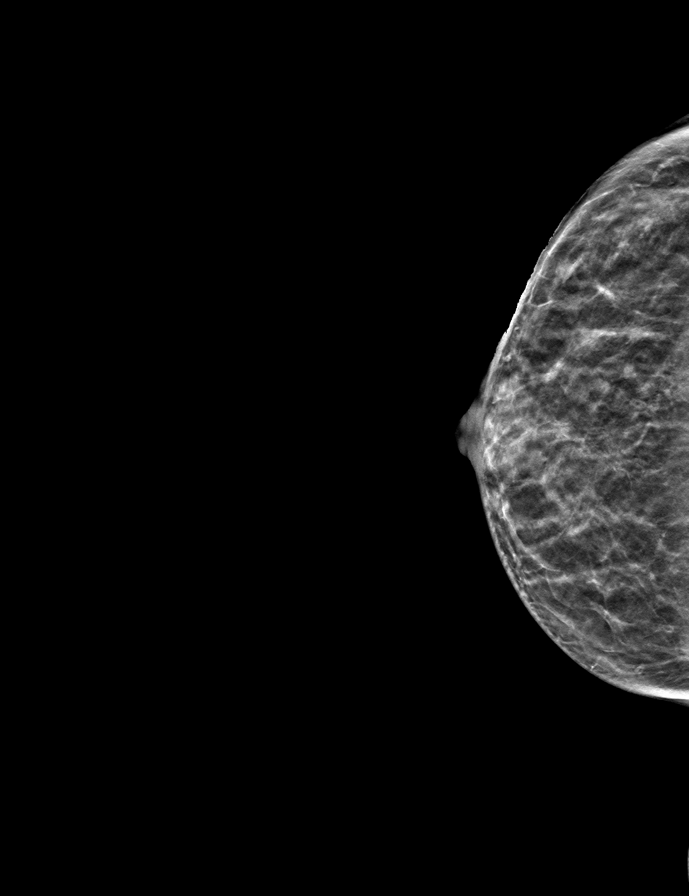

[9 of 24 positions shown; findings below may reference images not displayed]

ACR Breast Density Category b: There are scattered areas of
fibroglandular density.
FINDINGS: There are no findings suspicious for malignancy. Images were
processed with CAD.
IMPRESSION: No mammographic evidence of malignancy. A result letter of this
screening mammogram will be mailed directly to the patient.

RECOMMENDATION:
Screening mammogram in one year. (Code:[id number])

BI-RADS CATEGORY  1: Negative.

## 2020-01-10 ENCOUNTER — Other Ambulatory Visit: Payer: Self-pay | Admitting: Internal Medicine

## 2020-01-10 DIAGNOSIS — Z1231 Encounter for screening mammogram for malignant neoplasm of breast: Secondary | ICD-10-CM

## 2020-02-16 ENCOUNTER — Inpatient Hospital Stay: Admission: RE | Admit: 2020-02-16 | Payer: Medicaid Other | Source: Ambulatory Visit

## 2020-05-18 ENCOUNTER — Other Ambulatory Visit: Payer: Self-pay

## 2020-05-18 ENCOUNTER — Ambulatory Visit
Admission: RE | Admit: 2020-05-18 | Discharge: 2020-05-18 | Disposition: A | Discharge status: Home / Self Care | Payer: Medicaid Other | Source: Ambulatory Visit | Attending: Internal Medicine | Admitting: Internal Medicine

## 2020-05-18 DIAGNOSIS — Z1231 Encounter for screening mammogram for malignant neoplasm of breast: Secondary | ICD-10-CM

## 2021-04-09 ENCOUNTER — Other Ambulatory Visit: Payer: Self-pay | Admitting: Internal Medicine

## 2021-04-09 DIAGNOSIS — Z1231 Encounter for screening mammogram for malignant neoplasm of breast: Secondary | ICD-10-CM

## 2021-04-24 ENCOUNTER — Ambulatory Visit (INDEPENDENT_AMBULATORY_CARE_PROVIDER_SITE_OTHER): Payer: Medicare Other

## 2021-04-24 ENCOUNTER — Ambulatory Visit (INDEPENDENT_AMBULATORY_CARE_PROVIDER_SITE_OTHER): Payer: Medicare Other | Admitting: Podiatry

## 2021-04-24 DIAGNOSIS — M7661 Achilles tendinitis, right leg: Secondary | ICD-10-CM | POA: Diagnosis not present

## 2021-04-24 DIAGNOSIS — M7662 Achilles tendinitis, left leg: Secondary | ICD-10-CM

## 2021-04-24 MED ORDER — BETAMETHASONE SOD PHOS & ACET 6 (3-3) MG/ML IJ SUSP
3.0000 mg | Freq: Once | INTRAMUSCULAR | Status: AC
  Administered 2021-04-24: 3 mg via INTRA_ARTICULAR

## 2021-04-24 NOTE — Progress Notes (Signed)
? ?  HPI: 66 y.o. female presenting today as a new patient for evaluation of chronic bilateral foot pain.  Patient states that she does have a history of Achilles tendinitis to the left lower extremity that has flared up again.  She has received shots in the past which have helped significantly.  She is requesting an injection today.  She says the pain is actually on the outside of her heel adjacent to her Achilles tendon. ?Patient also states that she developed symptomatic calluses to the medial aspect of the left great toe.  She would like to have it evaluated ? ?Past Medical History:  ?Diagnosis Date  ? Allergy   ? Anemia   ? Anxiety   ? Arthritis   ? Asthma   ? Bursitis   ? COPD (chronic obstructive pulmonary disease) ([place])   ? Hyperlipidemia   ? Hypertension   ? Neuromuscular disorder ([place])   ? fibromyalgia  ? Osteoporosis   ? ? ?Past Surgical History:  ?Procedure Laterality Date  ? BACK SURGERY  2004  ? BREAST BIOPSY    ? CESAREAN SECTION    ? x2  ? COLONOSCOPY    ? > 10 years ago -[place] imaging per pt and was normal  ? KNEE ARTHROPLASTY  2006  ? POLYPECTOMY    ? ? ?Allergies  ?Allergen Reactions  ? Aspirin   ?  Other reaction(s): swelling  ? Doxycycline   ?  Other reaction(s): rash  ? Elemental Sulfur   ? Penicillins   ? ?  ?Physical Exam: ?General: The patient is alert and oriented x3 in no acute distress. ? ?Dermatology: Skin is warm, dry and supple bilateral lower extremities. Negative for open lesions or macerations.  Hyperkeratotic preulcerative callus lesion noted to the medial aspect of the left great toe ? ?Vascular: Palpable pedal pulses bilaterally. Capillary refill within normal limits.  Negative for any significant edema or erythema ? ?Neurological: Light touch and protective threshold grossly intact ? ?Musculoskeletal Exam: No pedal deformities noted.  There is some tenderness to palpation to the lateral portion of the calcaneal tubercle left foot ? ?Radiographic Exam:  ?Normal osseous  mineralization. Joint spaces preserved. No fracture/dislocation/boney destruction.   ? ?Assessment: ?1.  Achilles tendinitis, insertional, lateral aspect ?2.  Hyperkeratotic callus medial border left great toe ? ? ?Plan of Care:  ?1. Patient evaluated. X-Rays reviewed.  ?2.  Injection of 0.5 cc Celestone Soluspan injected in the lateral portion of the calcaneal tubercle at the point of most tenderness left foot ?3.  Excisional debridement of the callus tissue was performed as a courtesy for the patient ?4.  Recommend wide fitting shoes that do not constrict the toebox area and irritated calluses ?5.  Return to clinic as needed ? ?  ?  ?Edrick Kins, DPM ?[facility] ? ?Dr. Edrick Kins, DPM  ?  ?[address]                                        ?[place], [place] [address]                ?Office [phone]  ?Fax [phone] ? ? ? ? ?

## 2021-05-20 ENCOUNTER — Ambulatory Visit: Payer: Medicaid Other

## 2021-06-28 ENCOUNTER — Ambulatory Visit
Admission: RE | Admit: 2021-06-28 | Discharge: 2021-06-28 | Disposition: A | Discharge status: Home / Self Care | Payer: Medicare Other | Source: Ambulatory Visit | Attending: Internal Medicine | Admitting: Internal Medicine

## 2021-06-28 DIAGNOSIS — Z1231 Encounter for screening mammogram for malignant neoplasm of breast: Secondary | ICD-10-CM

## 2021-12-09 ENCOUNTER — Ambulatory Visit (HOSPITAL_COMMUNITY)
Admission: EM | Admit: 2021-12-09 | Discharge: 2021-12-09 | Disposition: A | Discharge status: Home / Self Care | Payer: Medicare Other | Attending: Family Medicine | Admitting: Family Medicine

## 2021-12-09 ENCOUNTER — Ambulatory Visit (INDEPENDENT_AMBULATORY_CARE_PROVIDER_SITE_OTHER): Payer: Medicare Other

## 2021-12-09 ENCOUNTER — Encounter (HOSPITAL_COMMUNITY): Payer: Self-pay | Admitting: Emergency Medicine

## 2021-12-09 DIAGNOSIS — J441 Chronic obstructive pulmonary disease with (acute) exacerbation: Secondary | ICD-10-CM | POA: Insufficient documentation

## 2021-12-09 DIAGNOSIS — R0602 Shortness of breath: Secondary | ICD-10-CM | POA: Insufficient documentation

## 2021-12-09 DIAGNOSIS — Z7952 Long term (current) use of systemic steroids: Secondary | ICD-10-CM | POA: Diagnosis not present

## 2021-12-09 DIAGNOSIS — Z79899 Other long term (current) drug therapy: Secondary | ICD-10-CM | POA: Insufficient documentation

## 2021-12-09 DIAGNOSIS — R262 Difficulty in walking, not elsewhere classified: Secondary | ICD-10-CM | POA: Diagnosis not present

## 2021-12-09 DIAGNOSIS — R051 Acute cough: Secondary | ICD-10-CM | POA: Insufficient documentation

## 2021-12-09 DIAGNOSIS — R062 Wheezing: Secondary | ICD-10-CM | POA: Diagnosis not present

## 2021-12-09 DIAGNOSIS — Z792 Long term (current) use of antibiotics: Secondary | ICD-10-CM | POA: Insufficient documentation

## 2021-12-09 DIAGNOSIS — Z1152 Encounter for screening for COVID-19: Secondary | ICD-10-CM | POA: Diagnosis not present

## 2021-12-09 LAB — RESP PANEL BY RT-PCR (FLU A&B, COVID) ARPGX2
Influenza A by PCR: NEGATIVE
Influenza B by PCR: NEGATIVE
SARS Coronavirus 2 by RT PCR: NEGATIVE

## 2021-12-09 MED ORDER — BENZONATATE 100 MG PO CAPS: ORAL_CAPSULE | ORAL | 0 refills | Status: DC

## 2021-12-09 MED ORDER — PREDNISONE 20 MG PO TABS: 40.0000 mg | ORAL_TABLET | Freq: Every day | ORAL | 0 refills | Status: DC

## 2021-12-09 MED ORDER — AZITHROMYCIN 250 MG PO TABS: 250.0000 mg | ORAL_TABLET | Freq: Every day | ORAL | 0 refills | Status: DC

## 2021-12-09 NOTE — Discharge Instructions (Signed)
You have been tested for COVID-19 and influenza today. If your test returns positive, you will receive a phone call from [facility] regarding your results. Negative test results are not called. Both positive and negative results area always visible on MyChart. If you do not have a MyChart account, sign up instructions are provided in your discharge papers. Please do not hesitate to contact us should you have questions or concerns.

## 2021-12-09 NOTE — ED Triage Notes (Signed)
Hx of COPD, bronchitis. New/worsening cough over the last week with chills, SOB/wheezing worse than normal. Denies chest pain, palpitations, abdominal pain, N/V/D. Using robitussin at home without improvement. Cough keeping her awake at night. No change to color/amount of sputum.

## 2021-12-11 NOTE — ED Provider Notes (Signed)
[facility]   [id number] 12/09/21 Arrival Time: 3710  ASSESSMENT & PLAN:  1. COPD exacerbation (HCC)    No resp distress. I have personally viewed the imaging studies ordered this visit. No acute changes on CXR. No signs of PNA. COVID and influenza negative. OTC symptom care as needed.  Given history will treat with below. Discharge Medication List as of 12/09/2021 11:59 AM     START taking these medications   Details  azithromycin (ZITHROMAX) 250 MG tablet Take 1 tablet (250 mg total) by mouth daily. Take first 2 tablets together, then 1 every day until finished., Starting Mon 12/09/2021, Normal    benzonatate (TESSALON) 100 MG capsule Take 1 capsule by mouth every 8 (eight) hours for cough., Normal    predniSONE (DELTASONE) 20 MG tablet Take 2 tablets (40 mg total) by mouth daily., Starting Mon 12/09/2021, Normal         Follow-up Information     Ginger Organ., MD.   Specialty: Internal Medicine Why: If worsening or failing to improve as anticipated. Contact information: [address] [place] [address] [phone]                Reviewed expectations re: course of current medical issues. Questions answered. Outlined signs and symptoms indicating need for more acute intervention. Understanding verbalized. After Visit Summary given.   SUBJECTIVE: History from: Patient. Julia Howard is a 66 y.o. female. Reports: Hx of COPD, bronchitis. New/worsening cough over the last week with chills, SOB/wheezing worse than normal. Denies chest pain, palpitations, abdominal pain, N/V/D. Using robitussin at home without improvement. Cough keeping her awake at night. Cough is occasionally productive. No fevers reported.  OBJECTIVE:  Vitals:   12/09/21 1107  BP: 108/71  Pulse: 74  Resp: 16  Temp: 98 F (36.7 C)  TempSrc: Oral  SpO2: 97%    General appearance: alert; no distress Eyes: PERRLA; EOMI; conjunctiva normal HENT: [place]; AT; with  mild nasal congestion Neck: supple  Lungs: speaks full sentences without difficulty; unlabored; mild bilateral wheezing Extremities: no edema Skin: warm and dry Neurologic: normal gait Psychological: alert and cooperative; normal mood and affect  Labs: Results for orders placed or performed during the hospital encounter of 12/09/21  Resp Panel by RT-PCR (Flu A&B, Covid) Anterior Nasal Swab   Specimen: Anterior Nasal Swab  Result Value Ref Range   SARS Coronavirus 2 by RT PCR NEGATIVE NEGATIVE   Influenza A by PCR NEGATIVE NEGATIVE   Influenza B by PCR NEGATIVE NEGATIVE   Labs Reviewed  RESP PANEL BY RT-PCR (FLU A&B, COVID) [id number]   Imaging: DG Chest 2 View  Result Date: 12/09/2021 CLINICAL DATA:  Acute cough with chills, shortness of breath, and wheezing. History of COPD. EXAM: CHEST - 2 VIEW COMPARISON:  Chest radiographs 02/12/2010 FINDINGS: The cardiomediastinal silhouette is unchanged with normal heart size. The lungs remain hyperinflated with mild chronic coarsening of the interstitial markings. No airspace consolidation, edema, pleural effusion, or pneumothorax is identified. No acute osseous abnormality is seen. IMPRESSION: COPD without evidence of acute cardiopulmonary process. Electronically Signed   By: Logan Bores M.D.   On: 12/09/2021 11:33    Allergies  Allergen Reactions   Aspirin     Other reaction(s): swelling   Doxycycline     Other reaction(s): rash   Elemental Sulfur    Penicillins     Past Medical History:  Diagnosis Date   Allergy    Anemia    Anxiety  Arthritis    Asthma    Bursitis    COPD (chronic obstructive pulmonary disease) (HCC)    Hyperlipidemia    Hypertension    Neuromuscular disorder (HCC)    fibromyalgia   Osteoporosis    Social History   Socioeconomic History   Marital status: Widowed    Spouse name: Not on file   Number of children: Not on file   Years of education: Not on file   Highest education level: Not on file   Occupational History   Not on file  Tobacco Use   Smoking status: Never   Smokeless tobacco: Never  Substance and Sexual Activity   Alcohol use: No   Drug use: No   Sexual activity: Not Currently  Other Topics Concern   Not on file  Social History Narrative   Not on file   Social Determinants of Health   Financial Resource Strain: Not on file  Food Insecurity: Not on file  Transportation Needs: Not on file  Physical Activity: Not on file  Stress: Not on file  Social Connections: Not on file  Intimate Partner Violence: Not on file   Family History  Problem Relation Age of Onset   Heart disease Mother    Heart disease Maternal Grandmother    Breast cancer Maternal Aunt    Breast cancer Paternal Aunt    Colon polyps Neg Hx    Colon cancer Neg Hx    Esophageal cancer Neg Hx    Rectal cancer Neg Hx    Stomach cancer Neg Hx    Past Surgical History:  Procedure Laterality Date   BACK SURGERY  2004   BREAST BIOPSY     CESAREAN SECTION     x2   COLONOSCOPY     > 10 years ago -[facility] imaging per pt and was normal   KNEE ARTHROPLASTY  2006   POLYPECTOMY       Vanessa Kick, MD 12/11/21 1503

## 2022-04-30 ENCOUNTER — Other Ambulatory Visit (HOSPITAL_COMMUNITY): Payer: Self-pay | Admitting: *Deleted

## 2022-05-01 ENCOUNTER — Ambulatory Visit (HOSPITAL_COMMUNITY)
Admission: RE | Admit: 2022-05-01 | Discharge: 2022-05-01 | Disposition: A | Discharge status: Home / Self Care | Payer: 59 | Source: Ambulatory Visit | Attending: Internal Medicine | Admitting: Internal Medicine

## 2022-05-01 DIAGNOSIS — M810 Age-related osteoporosis without current pathological fracture: Secondary | ICD-10-CM | POA: Diagnosis not present

## 2022-05-01 MED ORDER — DENOSUMAB 60 MG/ML ~~LOC~~ SOSY
60.0000 mg | PREFILLED_SYRINGE | Freq: Once | SUBCUTANEOUS | Status: AC
  Administered 2022-05-01: 60 mg via SUBCUTANEOUS

## 2022-05-01 MED ORDER — DENOSUMAB 60 MG/ML ~~LOC~~ SOSY
PREFILLED_SYRINGE | SUBCUTANEOUS | Status: AC
  Filled 2022-05-01: qty 1

## 2022-05-19 ENCOUNTER — Other Ambulatory Visit: Payer: Self-pay | Admitting: Internal Medicine

## 2022-05-19 DIAGNOSIS — Z0000 Encounter for general adult medical examination without abnormal findings: Secondary | ICD-10-CM

## 2022-06-30 ENCOUNTER — Ambulatory Visit: Payer: 59

## 2022-08-13 ENCOUNTER — Ambulatory Visit: Admission: RE | Admit: 2022-08-13 | Payer: 59 | Source: Ambulatory Visit

## 2022-08-13 DIAGNOSIS — Z0000 Encounter for general adult medical examination without abnormal findings: Secondary | ICD-10-CM

## 2022-09-13 ENCOUNTER — Ambulatory Visit (HOSPITAL_COMMUNITY)
Admission: EM | Admit: 2022-09-13 | Discharge: 2022-09-13 | Disposition: A | Discharge status: Home / Self Care | Payer: 59

## 2022-09-13 ENCOUNTER — Encounter (HOSPITAL_COMMUNITY): Payer: Self-pay

## 2022-09-13 DIAGNOSIS — J441 Chronic obstructive pulmonary disease with (acute) exacerbation: Secondary | ICD-10-CM | POA: Diagnosis not present

## 2022-09-13 DIAGNOSIS — H6991 Unspecified Eustachian tube disorder, right ear: Secondary | ICD-10-CM | POA: Diagnosis not present

## 2022-09-13 MED ORDER — IPRATROPIUM-ALBUTEROL 0.5-2.5 (3) MG/3ML IN SOLN
RESPIRATORY_TRACT | Status: AC
  Filled 2022-09-13: qty 3

## 2022-09-13 MED ORDER — PREDNISONE 50 MG PO TABS: 50.0000 mg | ORAL_TABLET | Freq: Every day | ORAL | 0 refills | Status: AC

## 2022-09-13 MED ORDER — IPRATROPIUM-ALBUTEROL 0.5-2.5 (3) MG/3ML IN SOLN: 3.0000 mL | RESPIRATORY_TRACT | 0 refills | Status: AC | PRN

## 2022-09-13 MED ORDER — IPRATROPIUM-ALBUTEROL 0.5-2.5 (3) MG/3ML IN SOLN
3.0000 mL | Freq: Once | RESPIRATORY_TRACT | Status: AC
  Administered 2022-09-13: 3 mL via RESPIRATORY_TRACT

## 2022-09-13 NOTE — Discharge Instructions (Addendum)
Your symptoms are most consistent with a COPD exacerbation.  Please take the prednisone once daily in the morning. Use the duoneb nebulizer solution in your new machine every 4-6 hours around the clock until your symptoms improve.  Continue taking your Symbicort daily. Use saline nasal spray to help open your eustachian tube. Call your PCP to schedule a follow up within the next one week.

## 2022-09-13 NOTE — ED Provider Notes (Signed)
[facility]    CSN: [id number] Arrival date & time: 09/13/22  1008      History   Chief Complaint No chief complaint on file.   HPI Julia Howard is a 67 y.o. female.   67 year old female with a known history of allergies asthma and COPD presents today due to concerns of shortness of breath, dry cough, and tightness in her chest for the past week.  She reports that 2 weeks ago it actually started with a pain and pressure in her right ear only.  She reports some discomfort to her right sinus as well.  She denies sore throat or postnasal drainage.  She denies fever.  She states her cough is dry and has no mucus production.  She uses a daily preventative medication, possibly Symbicort per her med list, and a rescue inhaler.  Over the past week, she is only needed her rescue inhaler 2 or 3 times.  She denies edema or weight gain.  She does feel slightly short of breath laying back at night, but denies PND.     Past Medical History:  Diagnosis Date   Allergy    Anemia    Anxiety    Arthritis    Asthma    Bursitis    COPD (chronic obstructive pulmonary disease) (HCC)    Hyperlipidemia    Hypertension    Neuromuscular disorder (HCC)    fibromyalgia   Osteoporosis     Patient Active Problem List   Diagnosis Date Noted   Neuromuscular disorder (HCC)    Hypertension    COPD (chronic obstructive pulmonary disease) (HCC)    Asthma    Anemia    Blurred vision, right eye 01/04/2013    Past Surgical History:  Procedure Laterality Date   BACK SURGERY  2004   BREAST BIOPSY     CESAREAN SECTION     x2   COLONOSCOPY     > 10 years ago -[facility] imaging per pt and was normal   KNEE ARTHROPLASTY  2006   POLYPECTOMY      OB History   No obstetric history on file.      Home Medications    Prior to Admission medications   Medication Sig Start Date End Date Taking? Authorizing Provider  denosumab (PROLIA) 60 MG/ML SOSY injection Inject 60 mg into the skin every 6  (six) months.   Yes [provider]  ipratropium-albuterol (DUONEB) 0.5-2.5 (3) MG/3ML SOLN Take 3 mLs by nebulization every 4 (four) hours as needed. 09/13/22  Yes Enjoli Tidd L, PA  predniSONE (DELTASONE) 50 MG tablet Take 1 tablet (50 mg total) by mouth daily with breakfast. 09/13/22  Yes Cristan Hout L, PA  acetaminophen (TYLENOL) 500 MG tablet Take 500 mg by mouth every 6 (six) hours as needed for mild pain or fever.     [provider]  albuterol (PROVENTIL HFA;VENTOLIN HFA) 108 (90 BASE) MCG/ACT inhaler Inhale 1-2 puffs into the lungs every 6 (six) hours as needed for wheezing or shortness of breath.    [provider]  atorvastatin (LIPITOR) 10 MG tablet Take 10 mg by mouth daily.    [provider]  Calcium 600-200 MG-UNIT tablet Take 1 tablet by mouth daily.    [provider]  SYMBICORT 160-4.5 MCG/ACT inhaler TAKE 2 PUFFS BY MOUTH TWICE A DAY 06/18/18   [provider]  Tiotropium Bromide Monohydrate (SPIRIVA RESPIMAT) 2.5 MCG/ACT AERS Inhale into the lungs 2 (two) times daily after a  meal.    [provider]  triamcinolone (KENALOG) 0.025 % cream Apply tid to affected areas, only to face once daily 03/30/18   [provider]  Vitamin D, Ergocalciferol, (DRISDOL) 50000 units CAPS capsule TAKE ONE TABLET ONCE WEEKLY 06/10/16   [provider]  VOLTAREN 1 % GEL APPLY 2 GRAMS TO PAINFUL UPPER EXTREMITY JOINTS AND 4 GRAMS TO LOWER JOINTS EVERY 6HRS AS NEEDED 06/10/16   [provider]    Family History Family History  Problem Relation Age of Onset   Heart disease Mother    Heart disease Maternal Grandmother    Breast cancer Maternal Aunt    Breast cancer Paternal Aunt    Colon polyps Neg Hx    Colon cancer Neg Hx    Esophageal cancer Neg Hx    Rectal cancer Neg Hx    Stomach cancer Neg Hx     Social History Social History   Tobacco Use   Smoking status: Never   Smokeless tobacco: Never   Substance Use Topics   Alcohol use: No   Drug use: No     Allergies   Aspirin, Doxycycline, Elemental sulfur, and Penicillins   Review of Systems Review of Systems As per HPI  Physical Exam Triage Vital Signs ED Triage Vitals  Encounter Vitals Group     BP 09/13/22 1055 (!) 142/88     Systolic BP Percentile --      Diastolic BP Percentile --      Pulse Rate 09/13/22 1055 85     Resp 09/13/22 1055 17     Temp 09/13/22 1054 98.6 F (37 C)     Temp Source 09/13/22 1054 Oral     SpO2 09/13/22 1055 96 %     Weight 09/13/22 1056 100 lb (45.4 kg)     Height 09/13/22 1056 5' 2.5" (1.588 m)     Head Circumference --      Peak Flow --      Pain Score 09/13/22 1056 8     Pain Loc --      Pain Education --      Exclude from Growth Chart --    No data found.  Updated Vital Signs BP (!) 142/88 (BP Location: Left Arm)   Pulse 85   Temp 98.6 F (37 C) (Oral)   Resp 17   Ht 5' 2.5" (1.588 m)   Wt 100 lb (45.4 kg)   SpO2 96%   BMI 18.00 kg/m   Visual Acuity Right Eye Distance:   Left Eye Distance:   Bilateral Distance:    Right Eye Near:   Left Eye Near:    Bilateral Near:     Physical Exam Vitals and nursing note reviewed.  Constitutional:      General: She is not in acute distress.    Appearance: Normal appearance. She is well-developed. She is not ill-appearing or toxic-appearing.     Comments: thin  HENT:     Head: Normocephalic and atraumatic.     Right Ear: Tympanic membrane, ear canal and external ear normal. There is no impacted cerumen.     Left Ear: Tympanic membrane, ear canal and external ear normal. There is no impacted cerumen.     Nose: Nose normal. No congestion or rhinorrhea.     Mouth/Throat:     Mouth: Mucous membranes are moist.     Pharynx: Oropharynx is clear. No oropharyngeal exudate or posterior oropharyngeal erythema.  Eyes:     General:  No scleral icterus.       Right eye: No discharge.        Left eye: No discharge.      Extraocular Movements: Extraocular movements intact.     Conjunctiva/sclera: Conjunctivae normal.     Pupils: Pupils are equal, round, and reactive to light.  Cardiovascular:     Rate and Rhythm: Normal rate and regular rhythm.     Heart sounds: No murmur heard. Pulmonary:     Effort: Pulmonary effort is normal. No accessory muscle usage, respiratory distress or retractions.     Breath sounds: Normal air entry. No stridor, decreased air movement or transmitted upper airway sounds. Wheezing (scant posterior lung fields) present. No decreased breath sounds, rhonchi or rales.     Comments: Decreased breath sounds posteriorly all lung fields prior to neb; improved s/p neb Abdominal:     Palpations: Abdomen is soft.  Musculoskeletal:        General: No swelling.     Cervical back: Normal range of motion and neck supple. No rigidity or tenderness.     Right lower leg: No edema.     Left lower leg: No edema.  Lymphadenopathy:     Cervical: No cervical adenopathy.  Skin:    General: Skin is warm and dry.     Capillary Refill: Capillary refill takes less than 2 seconds.     Coloration: Skin is not jaundiced.     Findings: No bruising, erythema or rash.  Neurological:     General: No focal deficit present.     Mental Status: She is alert and oriented to person, place, and time.     Cranial Nerves: No cranial nerve deficit.     Sensory: No sensory deficit.  Psychiatric:        Mood and Affect: Mood normal.      UC Treatments / Results  Labs (all labs ordered are listed, but only abnormal results are displayed) Labs Reviewed - No data to display  EKG   Radiology No results found.  Procedures Procedures (including critical care time)  Medications Ordered in UC Medications  ipratropium-albuterol (DUONEB) 0.5-2.5 (3) MG/3ML nebulizer solution 3 mL (3 mLs Nebulization Given 09/13/22 1120)    Initial Impression / Assessment and Plan / UC Course  I have reviewed the triage vital  signs and the nursing notes.  Pertinent labs & imaging results that were available during my care of the patient were reviewed by me and considered in my medical decision making (see chart for details).     COPD exacerbation - significant sx improvement noted s/p neb in office. Pt is not having mucopurulent sputum production therefore no abx indicated at present time. Will start with PO steroids and nebs Q4-6 hours. Neb machine was provided to patient at the [facility] and solution called in to pharmacy. Pt instructed to continue her home medications. ETD R - no signs of bacterial ear infection. Appears to be ETD in which PO prednisone should help. Also encouraged saline nasal spray.   Final Clinical Impressions(s) / UC Diagnoses   Final diagnoses:  COPD exacerbation (HCC)  Eustachian tube dysfunction, right     Discharge Instructions      Your symptoms are most consistent with a COPD exacerbation.  Please take the prednisone once daily in the morning. Use the duoneb nebulizer solution in your new machine every 4-6 hours around the clock until your symptoms improve.  Continue taking your Symbicort daily. Use saline nasal spray to help  open your eustachian tube. Call your PCP to schedule a follow up within the next one week.      ED Prescriptions     Medication Sig Dispense Auth. Provider   predniSONE (DELTASONE) 50 MG tablet Take 1 tablet (50 mg total) by mouth daily with breakfast. 5 tablet Kendra Grissett L, PA   ipratropium-albuterol (DUONEB) 0.5-2.5 (3) MG/3ML SOLN Take 3 mLs by nebulization every 4 (four) hours as needed. 120 mL Royalty Domagala L, PA      PDMP not reviewed this encounter.   Maretta Bees, [place] 09/14/22 1016

## 2022-09-13 NOTE — ED Notes (Signed)
Julia Collins [place], PA ordered a nebulizer equipment for patient.  Completed paperwork and handed to patient

## 2022-09-13 NOTE — ED Triage Notes (Signed)
Patient presents with cough, right ear pain and side of abdomen pain when she cough x day 5. No treated used.

## 2022-11-06 ENCOUNTER — Other Ambulatory Visit (HOSPITAL_COMMUNITY): Payer: Self-pay | Admitting: *Deleted

## 2022-11-07 ENCOUNTER — Ambulatory Visit (HOSPITAL_COMMUNITY)
Admission: RE | Admit: 2022-11-07 | Discharge: 2022-11-07 | Disposition: A | Discharge status: Home / Self Care | Payer: 59 | Source: Ambulatory Visit | Attending: Internal Medicine | Admitting: Internal Medicine

## 2022-11-07 DIAGNOSIS — M810 Age-related osteoporosis without current pathological fracture: Secondary | ICD-10-CM | POA: Insufficient documentation

## 2022-11-07 MED ORDER — DENOSUMAB 60 MG/ML ~~LOC~~ SOSY
60.0000 mg | PREFILLED_SYRINGE | Freq: Once | SUBCUTANEOUS | Status: AC
  Administered 2022-11-07: 60 mg via SUBCUTANEOUS

## 2022-11-07 MED ORDER — DENOSUMAB 60 MG/ML ~~LOC~~ SOSY
PREFILLED_SYRINGE | SUBCUTANEOUS | Status: AC
  Filled 2022-11-07: qty 1

## 2023-05-20 ENCOUNTER — Other Ambulatory Visit (HOSPITAL_COMMUNITY): Payer: Self-pay | Admitting: *Deleted

## 2023-05-21 ENCOUNTER — Inpatient Hospital Stay (HOSPITAL_COMMUNITY): Admission: RE | Admit: 2023-05-21 | Source: Ambulatory Visit

## 2023-05-28 ENCOUNTER — Inpatient Hospital Stay (HOSPITAL_COMMUNITY): Admission: RE | Admit: 2023-05-28 | Source: Ambulatory Visit

## 2023-06-09 ENCOUNTER — Telehealth: Payer: Self-pay

## 2023-06-09 NOTE — Telephone Encounter (Signed)
 Auth Submission: APPROVED Site of care: Site of care: [facility] Payer: [employer] medicare Dual complete Medication & CPT/J Code(s) submitted: Prolia  (Denosumab ) [id number] Route of submission (phone, fax, portal):  Phone # Fax # Auth type: Buy/Bill PB Units/visits requested: 60mg  x 2 doses Reference number: [id number] Approval from: 05/18/23 to 05/17/24

## 2023-06-11 ENCOUNTER — Encounter (HOSPITAL_COMMUNITY)
Admission: RE | Admit: 2023-06-11 | Discharge: 2023-06-11 | Disposition: A | Discharge status: Home / Self Care | Source: Ambulatory Visit | Attending: Internal Medicine | Admitting: Internal Medicine

## 2023-06-11 DIAGNOSIS — M810 Age-related osteoporosis without current pathological fracture: Secondary | ICD-10-CM | POA: Insufficient documentation

## 2023-06-11 MED ORDER — DENOSUMAB 60 MG/ML ~~LOC~~ SOSY
60.0000 mg | PREFILLED_SYRINGE | Freq: Once | SUBCUTANEOUS | Status: AC
  Administered 2023-06-11: 60 mg via SUBCUTANEOUS

## 2023-06-11 MED ORDER — DENOSUMAB 60 MG/ML ~~LOC~~ SOSY
PREFILLED_SYRINGE | SUBCUTANEOUS | Status: AC
  Filled 2023-06-11: qty 1

## 2023-07-13 ENCOUNTER — Other Ambulatory Visit: Payer: Self-pay | Admitting: Internal Medicine

## 2023-07-13 DIAGNOSIS — Z1231 Encounter for screening mammogram for malignant neoplasm of breast: Secondary | ICD-10-CM

## 2023-08-14 ENCOUNTER — Ambulatory Visit

## 2023-09-01 ENCOUNTER — Ambulatory Visit

## 2023-10-20 ENCOUNTER — Other Ambulatory Visit (HOSPITAL_COMMUNITY): Payer: Self-pay | Admitting: Internal Medicine

## 2023-10-20 DIAGNOSIS — M810 Age-related osteoporosis without current pathological fracture: Secondary | ICD-10-CM | POA: Insufficient documentation

## 2023-11-09 ENCOUNTER — Encounter (HOSPITAL_COMMUNITY): Payer: Self-pay | Admitting: Internal Medicine

## 2023-11-25 ENCOUNTER — Encounter: Payer: Self-pay | Admitting: Podiatry

## 2023-11-25 ENCOUNTER — Ambulatory Visit: Admitting: Podiatry

## 2023-11-25 ENCOUNTER — Ambulatory Visit

## 2023-11-25 VITALS — Ht 62.5 in | Wt 100.0 lb

## 2023-11-25 DIAGNOSIS — S92504A Nondisplaced unspecified fracture of right lesser toe(s), initial encounter for closed fracture: Secondary | ICD-10-CM

## 2023-11-25 DIAGNOSIS — M7751 Other enthesopathy of right foot: Secondary | ICD-10-CM | POA: Diagnosis not present

## 2023-11-25 NOTE — Progress Notes (Signed)
   Chief Complaint  Patient presents with   Toe Injury    Pt is here due to right 5th toe states she drop a can onto the toe a month ago and it has been hurting since.    Subjective: 68 y.o. female presenting to the office today for evaluation of pain and tenderness to the right fifth toe.  She does recall dropping an object on her toe about 1 month ago.  Over time the pain has improved.  She also has a symptomatic skin lesion overlying the toe as well contributing to her pain   Past Medical History:  Diagnosis Date   Allergy    Anemia    Anxiety    Arthritis    Asthma    Bursitis    COPD (chronic obstructive pulmonary disease) (HCC)    Hyperlipidemia    Hypertension    Neuromuscular disorder (HCC)    fibromyalgia   Osteoporosis     Past Surgical History:  Procedure Laterality Date   BACK SURGERY  2004   BREAST BIOPSY     CESAREAN SECTION     x2   COLONOSCOPY     > 10 years ago -[facility] imaging per pt and was normal   KNEE ARTHROPLASTY  2006   POLYPECTOMY      Allergies  Allergen Reactions   Aspirin     Other reaction(s): swelling   Doxycycline     Other reaction(s): rash   Elemental Sulfur    Penicillins      Objective:  Physical Exam General: Alert and oriented x3 in no acute distress  Dermatology: Hyperkeratotic lesion(s) present on the right fifth toe. Pain on palpation with a central nucleated core noted. Skin is warm, dry and supple bilateral lower extremities. Negative for open lesions or macerations.  Vascular: Palpable pedal pulses bilaterally. No edema or erythema noted. Capillary refill within normal limits.  Neurological: Grossly intact via light touch  Musculoskeletal Exam: Pain on palpation at the keratotic lesion(s) noted. Range of motion within normal limits bilateral. Muscle strength 5/5 in all groups bilateral.  Radiographic exam RT foot 11/25/2023: Subacute comminuted fracture noted to the head of the proximal phalanx right fifth toe.   Overall decent alignment  Assessment: 1.  Symptomatic benign skin lesion right fifth toe 2.  Closed nondisplaced comminuted fracture proximal phalanx right fifth toe   Plan of Care:  -Patient evaluated.  X-rays reviewed -The fracture is over 73 month old.  Recommend that she simply gives it time to allow the fractures to heal.  There has been improvement over the past month -Excisional debridement of keratoic lesion(s) using a chisel blade was performed without incident.  - Recommend good supportive tennis shoes and sneakers -Return to the clinic PRN.   Thresa EMERSON Sar, DPM [facility]  Dr. Thresa EMERSON Sar, DPM    [address] [place], [place] [address]                Office [phone]  Fax [phone]

## 2023-12-14 ENCOUNTER — Inpatient Hospital Stay (HOSPITAL_COMMUNITY): Admission: RE | Admit: 2023-12-14 | Source: Ambulatory Visit

## 2023-12-23 ENCOUNTER — Inpatient Hospital Stay (HOSPITAL_COMMUNITY): Admission: RE | Admit: 2023-12-23 | Discharge: 2023-12-23 | Attending: Internal Medicine

## 2023-12-23 VITALS — BP 134/87 | HR 54 | Temp 97.5°F | Resp 16

## 2023-12-23 DIAGNOSIS — M810 Age-related osteoporosis without current pathological fracture: Secondary | ICD-10-CM | POA: Insufficient documentation

## 2023-12-23 MED ORDER — DENOSUMAB 60 MG/ML ~~LOC~~ SOSY
PREFILLED_SYRINGE | SUBCUTANEOUS | Status: AC
  Filled 2023-12-23: qty 1

## 2023-12-23 MED ORDER — DENOSUMAB 60 MG/ML ~~LOC~~ SOSY
60.0000 mg | PREFILLED_SYRINGE | Freq: Once | SUBCUTANEOUS | Status: AC
  Administered 2023-12-23: 13:00:00 60 mg via SUBCUTANEOUS

## 2024-01-15 ENCOUNTER — Encounter: Payer: Self-pay | Admitting: Internal Medicine

## 2024-01-20 ENCOUNTER — Telehealth: Payer: Self-pay

## 2024-01-20 ENCOUNTER — Encounter

## 2024-01-20 NOTE — Telephone Encounter (Signed)
 RN attempted to call patient, no answer. LVM informing patient RN would call back in 10 minutes. [facility] number left to call and reschedule if needed.

## 2024-01-20 NOTE — Telephone Encounter (Signed)
 Patient did not call back by 5:00 pm to reschedule PV.  RN cancelled PV and colonoscopy. Letter sent via mail.

## 2024-01-20 NOTE — Telephone Encounter (Signed)
 RN attempted to call patient a second time, No answer.RN left a voicemail for the patient to call us  back today before 5 pm or the PV and procedure will be cancelled.   Notified via mail.

## 2024-02-02 ENCOUNTER — Encounter (HOSPITAL_COMMUNITY): Payer: Self-pay | Admitting: Internal Medicine

## 2024-02-11 ENCOUNTER — Encounter

## 2024-02-12 ENCOUNTER — Encounter

## 2024-02-16 ENCOUNTER — Encounter: Admitting: Internal Medicine

## 2024-02-25 ENCOUNTER — Encounter: Admitting: Internal Medicine

## 2024-06-14 ENCOUNTER — Encounter (HOSPITAL_COMMUNITY)

## 2024-06-23 ENCOUNTER — Encounter (HOSPITAL_COMMUNITY)
